# Patient Record
Sex: Male | Born: 1975 | Race: Black or African American | Hispanic: No | Marital: Married | Smoking: Never smoker
Health system: Southern US, Community
[De-identification: ages and names within clinical notes are randomized; demographics above are authoritative.]

## PROBLEM LIST (undated history)

## (undated) DIAGNOSIS — T7840XA Allergy, unspecified, initial encounter: Secondary | ICD-10-CM

## (undated) DIAGNOSIS — F419 Anxiety disorder, unspecified: Secondary | ICD-10-CM

## (undated) DIAGNOSIS — F32A Depression, unspecified: Secondary | ICD-10-CM

## (undated) HISTORY — DX: Anxiety disorder, unspecified: F41.9

## (undated) HISTORY — PX: FINGER SURGERY: SHX640

## (undated) HISTORY — PX: COSMETIC SURGERY: SHX468

## (undated) HISTORY — DX: Allergy, unspecified, initial encounter: T78.40XA

## (undated) HISTORY — DX: Depression, unspecified: F32.A

---

## 1997-09-16 ENCOUNTER — Emergency Department (HOSPITAL_COMMUNITY): Admission: EM | Admit: 1997-09-16 | Discharge: 1997-09-16 | Payer: Self-pay

## 2009-06-25 ENCOUNTER — Ambulatory Visit: Payer: Self-pay | Admitting: Diagnostic Radiology

## 2009-06-25 ENCOUNTER — Emergency Department (HOSPITAL_BASED_OUTPATIENT_CLINIC_OR_DEPARTMENT_OTHER): Admission: EM | Admit: 2009-06-25 | Discharge: 2009-06-25 | Payer: Self-pay | Admitting: Emergency Medicine

## 2009-07-06 ENCOUNTER — Ambulatory Visit: Payer: Self-pay | Admitting: Diagnostic Radiology

## 2009-07-06 ENCOUNTER — Ambulatory Visit (HOSPITAL_BASED_OUTPATIENT_CLINIC_OR_DEPARTMENT_OTHER): Admission: RE | Admit: 2009-07-06 | Discharge: 2009-07-06 | Payer: Self-pay | Admitting: Orthopaedic Surgery

## 2010-05-20 LAB — COMPREHENSIVE METABOLIC PANEL
AST: 35 U/L (ref 0–37)
Albumin: 4.4 g/dL (ref 3.5–5.2)
CO2: 27 mEq/L (ref 19–32)
Calcium: 9.7 mg/dL (ref 8.4–10.5)
Chloride: 102 mEq/L (ref 96–112)
Creatinine, Ser: 1.4 mg/dL (ref 0.4–1.5)
Sodium: 143 mEq/L (ref 135–145)
Total Bilirubin: 1.1 mg/dL (ref 0.3–1.2)

## 2010-05-20 LAB — CBC
MCHC: 32.8 g/dL (ref 30.0–36.0)
MCV: 87.2 fL (ref 78.0–100.0)
RBC: 4.78 MIL/uL (ref 4.22–5.81)
RDW: 11.5 % (ref 11.5–15.5)
WBC: 5.5 10*3/uL (ref 4.0–10.5)

## 2010-07-11 ENCOUNTER — Emergency Department (HOSPITAL_COMMUNITY): Payer: No Typology Code available for payment source

## 2010-07-11 ENCOUNTER — Emergency Department (HOSPITAL_COMMUNITY)
Admission: EM | Admit: 2010-07-11 | Discharge: 2010-07-11 | Disposition: A | Discharge status: Home / Self Care | Payer: No Typology Code available for payment source | Attending: Emergency Medicine | Admitting: Emergency Medicine

## 2010-07-11 DIAGNOSIS — IMO0002 Reserved for concepts with insufficient information to code with codable children: Secondary | ICD-10-CM | POA: Insufficient documentation

## 2010-07-11 DIAGNOSIS — M542 Cervicalgia: Secondary | ICD-10-CM | POA: Insufficient documentation

## 2013-01-02 ENCOUNTER — Encounter (HOSPITAL_BASED_OUTPATIENT_CLINIC_OR_DEPARTMENT_OTHER): Payer: Self-pay | Admitting: Emergency Medicine

## 2013-01-02 ENCOUNTER — Emergency Department (HOSPITAL_BASED_OUTPATIENT_CLINIC_OR_DEPARTMENT_OTHER)
Admission: EM | Admit: 2013-01-02 | Discharge: 2013-01-02 | Disposition: A | Discharge status: Home / Self Care | Payer: Self-pay | Attending: Emergency Medicine | Admitting: Emergency Medicine

## 2013-01-02 DIAGNOSIS — K644 Residual hemorrhoidal skin tags: Secondary | ICD-10-CM | POA: Insufficient documentation

## 2013-01-02 MED ORDER — HYDROCORTISONE ACE-PRAMOXINE 1-1 % RE FOAM: 1.0000 | Freq: Two times a day (BID) | RECTAL | Status: DC

## 2013-01-02 MED ORDER — OXYCODONE-ACETAMINOPHEN 5-325 MG PO TABS
1.0000 | ORAL_TABLET | Freq: Once | ORAL | Status: DC
  Filled 2013-01-02: qty 1

## 2013-01-02 NOTE — ED Notes (Signed)
Pt reports rectal pain x 3 weeks.  Reports itching and pain.  Hx of hemorrhoids.

## 2013-01-02 NOTE — ED Provider Notes (Signed)
CSN: [id number]     Arrival date & time 01/02/13  0957 History   First MD Initiated Contact with Patient 01/02/13 1009     Chief Complaint  Patient presents with  . Hemorrhoids   (Consider location/radiation/quality/duration/timing/severity/associated sxs/prior Treatment) Patient is a 37 y.o. male presenting with hematochezia.  Rectal Bleeding Duration:  3 weeks Timing:  Intermittent Progression:  Waxing and waning Chronicity:  Recurrent Context: hemorrhoids   Similar prior episodes: yes   Relieved by:  None tried Worsened by:  Defecation Ineffective treatments:  None tried Patient reports three week history of rectal itching and pain.  Has a history of hemorrhoids.  Has not noticed any blood on tissue after defecation.   History reviewed. No pertinent past medical history. History reviewed. No pertinent past surgical history. History reviewed. No pertinent family history. History  Substance Use Topics  . Smoking status: Never Smoker   . Smokeless tobacco: Not on file  . Alcohol Use: No    Review of Systems  Gastrointestinal: Positive for hematochezia and rectal pain. Negative for blood in stool and anal bleeding.  All other systems reviewed and are negative.    Allergies  Review of patient's allergies indicates no known allergies.  Home Medications  No current outpatient prescriptions on file. BP 143/82  Pulse 110  Temp(Src) 98.3 F (36.8 C) (Oral)  Resp 16  Ht 5\' 8"  (1.727 m)  Wt 177 lb (80.287 kg)  BMI 26.92 kg/m2  SpO2 100% Physical Exam  Nursing note and vitals reviewed. Constitutional: He is oriented to person, place, and time. He appears well-developed and well-nourished.  HENT:  Head: Normocephalic.  Eyes: Pupils are equal, round, and reactive to light.  Neck: Normal range of motion.  Cardiovascular: Normal rate and regular rhythm.   Pulmonary/Chest: Effort normal and breath sounds normal.  Abdominal: Soft. Bowel sounds are normal.  Genitourinary:  Rectal exam shows external hemorrhoid. Rectal exam shows no internal hemorrhoid.     Musculoskeletal: He exhibits no edema and no tenderness.  Lymphadenopathy:    He has no cervical adenopathy.  Neurological: He is alert and oriented to person, place, and time.  Skin: Skin is warm and dry. No rash noted.  Psychiatric: He has a normal mood and affect. His behavior is normal. Thought content normal.    ED Course  Procedures (including critical care time) Labs Review Labs Reviewed - No data to display Imaging Review No results found.  EKG Interpretation   None      Proctofoam HC prescription.  General surgery follow-up if needed. MDM   External hemorrhoid.    Jimmye Norman, NP 01/02/13 [phone]

## 2013-01-02 NOTE — ED Provider Notes (Signed)
Medical screening examination/treatment/procedure(s) were performed by non-physician practitioner and as supervising physician I was immediately available for consultation/collaboration.  EKG Interpretation   None         Glynn Octave, MD 01/02/13 1559

## 2013-02-03 ENCOUNTER — Encounter (HOSPITAL_BASED_OUTPATIENT_CLINIC_OR_DEPARTMENT_OTHER): Payer: Self-pay | Admitting: Emergency Medicine

## 2013-02-03 ENCOUNTER — Emergency Department (HOSPITAL_BASED_OUTPATIENT_CLINIC_OR_DEPARTMENT_OTHER): Payer: Self-pay

## 2013-02-03 ENCOUNTER — Emergency Department (HOSPITAL_BASED_OUTPATIENT_CLINIC_OR_DEPARTMENT_OTHER)
Admission: EM | Admit: 2013-02-03 | Discharge: 2013-02-03 | Disposition: A | Discharge status: Home / Self Care | Payer: Self-pay | Attending: Emergency Medicine | Admitting: Emergency Medicine

## 2013-02-03 DIAGNOSIS — IMO0002 Reserved for concepts with insufficient information to code with codable children: Secondary | ICD-10-CM | POA: Insufficient documentation

## 2013-02-03 DIAGNOSIS — Y92410 Unspecified street and highway as the place of occurrence of the external cause: Secondary | ICD-10-CM | POA: Insufficient documentation

## 2013-02-03 DIAGNOSIS — S8002XA Contusion of left knee, initial encounter: Secondary | ICD-10-CM

## 2013-02-03 DIAGNOSIS — S8000XA Contusion of unspecified knee, initial encounter: Secondary | ICD-10-CM | POA: Insufficient documentation

## 2013-02-03 DIAGNOSIS — Y9389 Activity, other specified: Secondary | ICD-10-CM | POA: Insufficient documentation

## 2013-02-03 MED ORDER — IBUPROFEN 200 MG PO TABS: 400.0000 mg | ORAL_TABLET | Freq: Four times a day (QID) | ORAL | Status: DC | PRN
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
## 2013-03-24 ENCOUNTER — Encounter (INDEPENDENT_AMBULATORY_CARE_PROVIDER_SITE_OTHER): Payer: Self-pay

## 2013-03-24 ENCOUNTER — Encounter: Payer: Self-pay | Admitting: Family Medicine

## 2013-03-24 ENCOUNTER — Ambulatory Visit (INDEPENDENT_AMBULATORY_CARE_PROVIDER_SITE_OTHER): Payer: Self-pay | Admitting: Family Medicine

## 2013-03-24 VITALS — BP 146/90 | HR 80 | Ht 68.0 in | Wt 182.0 lb

## 2013-03-24 DIAGNOSIS — S8990XA Unspecified injury of unspecified lower leg, initial encounter: Secondary | ICD-10-CM

## 2013-03-24 DIAGNOSIS — M25569 Pain in unspecified knee: Secondary | ICD-10-CM

## 2013-03-24 DIAGNOSIS — S99919A Unspecified injury of unspecified ankle, initial encounter: Secondary | ICD-10-CM

## 2013-03-24 DIAGNOSIS — S99929A Unspecified injury of unspecified foot, initial encounter: Secondary | ICD-10-CM

## 2013-03-24 DIAGNOSIS — S8992XA Unspecified injury of left lower leg, initial encounter: Secondary | ICD-10-CM

## 2013-03-24 DIAGNOSIS — M25562 Pain in left knee: Secondary | ICD-10-CM

## 2013-03-24 MED ORDER — MELOXICAM 15 MG PO TABS: 15.0000 mg | ORAL_TABLET | Freq: Every day | ORAL | Status: DC

## 2013-03-24 NOTE — Patient Instructions (Signed)
Your history and exam are consistent with a medial meniscus tear or a knee contusion that is taking longer than usual to resolve. Will treat conservatively for both. Start physical therapy and do home exercises on days you don't go to therapy. Take meloxicam 15mg  daily with food for pain and inflammation. Icing as needed 15 minutes at a time. Try to avoid deep squats, lunges, flexing knee fully unless in physical therapy and they advance you to these things. Follow up with me in 1 month to 6 weeks. If not improving would consider MRI at that time.

## 2013-03-28 ENCOUNTER — Encounter: Payer: Self-pay | Admitting: Family Medicine

## 2013-03-28 DIAGNOSIS — S8992XA Unspecified injury of left lower leg, initial encounter: Secondary | ICD-10-CM | POA: Insufficient documentation

## 2013-03-28 NOTE — Assessment & Plan Note (Signed)
consistent with a contusion most likely vs medial meniscal tear.  Start physical therapy, meloxicam, icing.  Will reassess in 1 month to 6 weeks.  If still not improving would consider MRI to assess for meniscal tear.
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
## 2013-03-30 ENCOUNTER — Encounter: Payer: Self-pay | Admitting: Family Medicine

## 2013-05-05 ENCOUNTER — Ambulatory Visit: Payer: Self-pay | Admitting: Family Medicine
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
## 2015-06-11 ENCOUNTER — Emergency Department (HOSPITAL_BASED_OUTPATIENT_CLINIC_OR_DEPARTMENT_OTHER)
Admission: EM | Admit: 2015-06-11 | Discharge: 2015-06-11 | Disposition: A | Discharge status: Home / Self Care | Payer: No Typology Code available for payment source | Attending: Emergency Medicine | Admitting: Emergency Medicine

## 2015-06-11 ENCOUNTER — Encounter (HOSPITAL_BASED_OUTPATIENT_CLINIC_OR_DEPARTMENT_OTHER): Payer: Self-pay | Admitting: *Deleted

## 2015-06-11 DIAGNOSIS — Y92410 Unspecified street and highway as the place of occurrence of the external cause: Secondary | ICD-10-CM | POA: Diagnosis not present

## 2015-06-11 DIAGNOSIS — Y999 Unspecified external cause status: Secondary | ICD-10-CM | POA: Insufficient documentation

## 2015-06-11 DIAGNOSIS — Y9389 Activity, other specified: Secondary | ICD-10-CM | POA: Diagnosis not present

## 2015-06-11 DIAGNOSIS — M545 Low back pain: Secondary | ICD-10-CM | POA: Diagnosis not present

## 2015-06-11 MED ORDER — METHOCARBAMOL 500 MG PO TABS: 500.0000 mg | ORAL_TABLET | Freq: Two times a day (BID) | ORAL | Status: DC

## 2015-06-11 MED ORDER — HYDROCODONE-ACETAMINOPHEN 5-325 MG PO TABS: 1.0000 | ORAL_TABLET | ORAL | Status: DC | PRN
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
## 2015-08-02 ENCOUNTER — Emergency Department (HOSPITAL_BASED_OUTPATIENT_CLINIC_OR_DEPARTMENT_OTHER)
Admission: EM | Admit: 2015-08-02 | Discharge: 2015-08-02 | Disposition: A | Discharge status: Home / Self Care | Payer: No Typology Code available for payment source | Attending: Emergency Medicine | Admitting: Emergency Medicine

## 2015-08-02 ENCOUNTER — Encounter (HOSPITAL_BASED_OUTPATIENT_CLINIC_OR_DEPARTMENT_OTHER): Payer: Self-pay | Admitting: *Deleted

## 2015-08-02 DIAGNOSIS — T7840XA Allergy, unspecified, initial encounter: Secondary | ICD-10-CM

## 2015-08-02 MED ORDER — METHYLPREDNISOLONE SODIUM SUCC 125 MG IJ SOLR
125.0000 mg | Freq: Once | INTRAMUSCULAR | Status: AC
  Administered 2015-08-02: 125 mg via INTRAVENOUS
  Filled 2015-08-02: qty 2

## 2015-08-02 MED ORDER — DIPHENHYDRAMINE HCL 50 MG/ML IJ SOLN
50.0000 mg | Freq: Once | INTRAMUSCULAR | Status: AC
  Administered 2015-08-02: 50 mg via INTRAVENOUS
  Filled 2015-08-02: qty 1

## 2015-08-02 MED ORDER — DIPHENHYDRAMINE HCL 25 MG PO TABS: 50.0000 mg | ORAL_TABLET | ORAL | Status: DC | PRN

## 2015-08-02 MED ORDER — PREDNISONE 20 MG PO TABS: ORAL_TABLET | ORAL | Status: DC

## 2015-08-02 MED ORDER — FAMOTIDINE IN NACL 20-0.9 MG/50ML-% IV SOLN
20.0000 mg | Freq: Once | INTRAVENOUS | Status: AC
  Administered 2015-08-02: 20 mg via INTRAVENOUS
  Filled 2015-08-02: qty 50

## 2015-08-02 MED ORDER — EPINEPHRINE 0.3 MG/0.3ML IJ SOAJ: 0.3000 mg | Freq: Once | INTRAMUSCULAR | Status: DC

## 2015-08-02 NOTE — ED Provider Notes (Signed)
Medical screening examination/treatment/procedure(s) were conducted as a shared visit with non-physician practitioner(s) and myself.  I personally evaluated the patient during the encounter.  Here with lip swelling, hives of unknown origin. Was bit by a bug last night, and subsequently woke up this AM with hives. Worsened throughout the day brought here for eval. Already had steroids, benadryl, pepcid on my evaluation. Still with angioedema of his lips and slight rash on right arm c/w hives.  Likely allergic reaction, is doing well with conservative allergy treatment at this time. Will observe and reevaluate for appropriatenss of discharge.   Marily MemosJason Eloina Ergle, MD 08/02/15 2137

## 2015-08-02 NOTE — ED Notes (Signed)
Pt. Still has edema noted in his lips upper and lower.  No hives noted on Pt. Body.  Pt. Able to swallow and talk with no difficulty.  Pt. In no resp. Distress.

## 2015-08-02 NOTE — ED Notes (Signed)
Woke with itching. Hives later in the day. Lip is swollen. He took Benadryl over 2 hours ago. He is in no respiratory distress.

## 2015-08-02 NOTE — ED Provider Notes (Signed)
CSN: [id number]     Arrival date & time 08/02/15  1425 History   First MD Initiated Contact with Patient 08/02/15 1433     Chief Complaint  Patient presents with  . Allergic Reaction     (Consider location/radiation/quality/duration/timing/severity/associated sxs/prior Treatment) The history is provided by the patient and medical records. No language interpreter was used.     Joshua Sims is a 40 y.o. male  with no major medical problems presents to the Emergency Department complaining of gradual, persistent, progressively worsening rash to the upper arms and right leg onset 7am this morning. Associated symptoms include lip swelling onset 11am this morning.  Pt reports taking 2 benadryl around 7:30 this morning without improvement.  He denies New lotions, detergents, clothing, environmental exposures. He reports he took a pre-workout tablet yesterday at 1 PM did not have any issues yesterday evening or overnight. Patient denies current or previous usage of lisinopril or statins.  He is not currently taking any medications.  Eyes any known allergies.  Eyes difficult breathing or difficulty swallowing. No nausea or vomiting. No abdominal pain. No history of hereditary angioedema.  History reviewed. No pertinent past medical history. Past Surgical History  Procedure Laterality Date  . Finger surgery     Family History  Problem Relation Age of Onset  . Sudden death Neg Hx   . Hypertension Neg Hx   . Hyperlipidemia Neg Hx   . Heart attack Neg Hx   . Diabetes Neg Hx    Social History  Substance Use Topics  . Smoking status: Never Smoker   . Smokeless tobacco: None  . Alcohol Use: No    Review of Systems  Constitutional: Negative for fever, diaphoresis, appetite change, fatigue and unexpected weight change.  HENT: Positive for facial swelling. Negative for mouth sores.   Eyes: Negative for visual disturbance.  Respiratory: Negative for cough, chest tightness, shortness of breath and  wheezing.   Cardiovascular: Negative for chest pain.  Gastrointestinal: Negative for nausea, vomiting, abdominal pain, diarrhea and constipation.  Endocrine: Negative for polydipsia, polyphagia and polyuria.  Genitourinary: Negative for dysuria, urgency, frequency and hematuria.  Musculoskeletal: Negative for back pain and neck stiffness.  Skin: Positive for rash.  Allergic/Immunologic: Negative for immunocompromised state.  Neurological: Negative for syncope, light-headedness and headaches.  Hematological: Does not bruise/bleed easily.  Psychiatric/Behavioral: Negative for sleep disturbance. The patient is not nervous/anxious.       Allergies  Review of patient's allergies indicates no known allergies.  Home Medications   Prior to Admission medications   Medication Sig Start Date End Date Taking? Authorizing Provider  HYDROcodone-acetaminophen (NORCO/VICODIN) 5-325 MG tablet Take 1 tablet by mouth every 4 (four) hours as needed. 06/11/15   Garlon HatchetLisa M Sanders, PA-C  hydrocortisone-pramoxine (PROCTOFOAM [facility]) rectal foam Place 1 applicator rectally 2 (two) times daily. 01/02/13   Felicie Mornavid Smith, NP  meloxicam (MOBIC) 15 MG tablet Take 1 tablet (15 mg total) by mouth daily. With food. 03/24/13   Lenda KelpShane R Hudnall, MD  methocarbamol (ROBAXIN) 500 MG tablet Take 1 tablet (500 mg total) by mouth 2 (two) times daily. 06/11/15   Garlon HatchetLisa M Sanders, PA-C   BP 128/92 mmHg  Pulse 86  Temp(Src) 98 F (36.7 C) (Oral)  Resp 20  Ht 5' 7.5" (1.715 m)  Wt 82.555 kg  BMI 28.07 kg/m2  SpO2 100% Physical Exam  Constitutional: He is oriented to person, place, and time. He appears well-developed and well-nourished. No distress.  HENT:  Head: Normocephalic and  atraumatic.  Right Ear: Tympanic membrane, external ear and ear canal normal.  Left Ear: Tympanic membrane, external ear and ear canal normal.  Nose: Nose normal. No mucosal edema or rhinorrhea.  Mouth/Throat: Uvula is midline and oropharynx is clear and  moist. No uvula swelling. No oropharyngeal exudate, posterior oropharyngeal edema, posterior oropharyngeal erythema or tonsillar abscesses.  No swelling of the uvula or oropharynx; no tongue swelling Swelling of the upper and lower lips noted   Eyes: Conjunctivae are normal.  Neck: Normal range of motion.  Patent airway No stridor; normal phonation Handling secretions without difficulty  Cardiovascular: Normal rate, normal heart sounds and intact distal pulses.   No murmur heard. Pulmonary/Chest: Effort normal and breath sounds normal. No stridor. No respiratory distress. He has no wheezes.  No wheezes or rhonchi  Abdominal: Soft. Bowel sounds are normal. There is no tenderness.  Musculoskeletal: Normal range of motion. He exhibits no edema.  Neurological: He is alert and oriented to person, place, and time.  Skin: Skin is warm and dry. Rash noted. He is not diaphoretic.  Urticaria noted to the bilateral upper arms and right hip and leg; no rash noted to the torso or back Mild excoriations - no induration or fluctuance to indicate secondary infection  Psychiatric: He has a normal mood and affect.  Nursing note and vitals reviewed.   ED Course  Procedures (including critical care time)   MDM   Final diagnoses:  Allergic reaction, initial encounter   Joshua Sims presents with allergic reaction including urticaria and swelling of the upper and lower lips. No history of angioedema or medications to suggest same. Patient will be given IV Benadryl, slightly Medrol and Pepcid.  3:00 PM At shift change care was transferred to Dr. Clayborne Dana who will re-evaluate and determine disposition.    Dierdre Forth, PA-C 08/02/15 1501  Marily Memos, MD 08/02/15 1530  Marily Memos, MD 08/02/15 1531

## 2018-12-15 ENCOUNTER — Other Ambulatory Visit: Payer: Self-pay

## 2018-12-15 DIAGNOSIS — Z20822 Contact with and (suspected) exposure to covid-19: Secondary | ICD-10-CM

## 2018-12-16 LAB — NOVEL CORONAVIRUS, NAA: SARS-CoV-2, NAA: NOT DETECTED

## 2021-07-03 ENCOUNTER — Encounter (HOSPITAL_COMMUNITY): Payer: Self-pay | Admitting: Emergency Medicine

## 2021-07-03 ENCOUNTER — Ambulatory Visit (HOSPITAL_COMMUNITY)
Admission: EM | Admit: 2021-07-03 | Discharge: 2021-07-03 | Disposition: A | Discharge status: Home / Self Care | Payer: Managed Care, Other (non HMO) | Attending: Family Medicine | Admitting: Family Medicine

## 2021-07-03 ENCOUNTER — Other Ambulatory Visit: Payer: Self-pay

## 2021-07-03 DIAGNOSIS — R3129 Other microscopic hematuria: Secondary | ICD-10-CM

## 2021-07-03 DIAGNOSIS — R072 Precordial pain: Secondary | ICD-10-CM | POA: Diagnosis not present

## 2021-07-03 DIAGNOSIS — F418 Other specified anxiety disorders: Secondary | ICD-10-CM | POA: Diagnosis not present

## 2021-07-03 LAB — POCT URINALYSIS DIPSTICK, ED / UC
Bilirubin Urine: NEGATIVE
Glucose, UA: 100 mg/dL — AB
Ketones, ur: NEGATIVE mg/dL
Leukocytes,Ua: NEGATIVE
Nitrite: NEGATIVE
Protein, ur: NEGATIVE mg/dL
Specific Gravity, Urine: 1.015 (ref 1.005–1.030)
Urobilinogen, UA: 0.2 mg/dL (ref 0.0–1.0)
pH: 7 (ref 5.0–8.0)

## 2021-07-03 NOTE — ED Triage Notes (Signed)
Patient noticed blood in urine this morning.  Patient says a month ago was given a dot test and was told blood in urine.  New pcp cant see patient until September 2023.   ? ?Denies pain presently.  Patient complains of stress  ? ?

## 2021-07-03 NOTE — ED Provider Notes (Signed)
?[facility] ? ? ? ?CSN: [id number] ?Arrival date & time: 07/03/21  1414 ? ? ?  ? ?History   ?Chief Complaint ?Chief Complaint  ?Patient presents with  ? Hematuria  ? ? ?HPI ?Joshua Sims is a 46 y.o. male.  ? ? ?Hematuria ? ?Here for 2 things ? ?First he had chest pain like a cramp in his left upper chest this morning when he was driving to work.  It lasted 15 or 20 minutes.  No associated diaphoresis, vomiting, or diarrhea.  No shortness of breath. ? 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  He cannot see his new primary care office till September.  He is established with another primary care office already. ?Maybe, he has had a little mild dysuria.  Urine has looked concentrated or dark sometimes ? ?No fever or chills or cough/cold symptoms. ? ?He does note a good bit of stress and anxiety.  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  No suicidal ideations ? ?History reviewed. No pertinent past medical history. ? ?Patient Active Problem List  ? Diagnosis Date Noted  ? Left knee injury 03/28/2013  ? ? ?Past Surgical History:  ?Procedure Laterality Date  ? FINGER SURGERY    ? ? ? ? ? ?Home Medications   ? ?Prior to Admission medications   ?Medication Sig Start Date End Date Taking? Authorizing Provider  ?EPINEPHrine (EPIPEN 2-PAK) 0.3 mg/0.3 mL IJ SOAJ injection Inject 0.3 mLs (0.3 mg total) into the muscle once. 08/02/15   Mesner, Barbara Cower, MD  ? ? ?Family History ?Family History  ?Problem Relation Age of Onset  ? Sudden death Neg Hx   ? Hypertension Neg Hx   ? Hyperlipidemia Neg Hx   ? Heart attack Neg Hx   ? Diabetes Neg Hx   ? ? ?Social History ?Social History  ? ?Tobacco Use  ? Smoking status: Never  ?Vaping Use  ? Vaping Use: Never used  ?Substance Use Topics  ? Alcohol use: No  ? Drug use: No  ? ? ? ?Allergies   ?Patient has no known allergies. ? ? ?Review of Systems ?Review of Systems  ?Genitourinary:  Positive for hematuria.  ? ? ?Physical  Exam ?Triage Vital Signs ?ED Triage Vitals  ?Enc Vitals Group  ?   BP 07/03/21 1453 (!) 145/88  ?   Pulse Rate 07/03/21 1453 85  ?   Resp 07/03/21 1453 20  ?   Temp 07/03/21 1453 98.5 ?F (36.9 ?C)  ?   Temp Source 07/03/21 1453 Oral  ?   SpO2 07/03/21 1453 98 %  ?   Weight --   ?   Height --   ?   Head Circumference --   ?   Peak Flow --   ?   Pain Score 07/03/21 1450 0  ?   Pain Loc --   ?   Pain Edu? --   ?   Excl. in GC? --   ? ?No data found. ? ?Updated Vital Signs ?BP (!) 145/88 (BP Location: Left Arm)   Pulse 85   Temp 98.5 ?F (36.9 ?C) (Oral)   Resp 20   SpO2 98%  ? ?Visual Acuity ?Right Eye Distance:   ?Left Eye Distance:   ?Bilateral Distance:   ? ?Right Eye Near:   ?Left Eye Near:    ?Bilateral Near:    ? ?Physical Exam ?Vitals reviewed.  ?Constitutional:   ?  General: He is not in acute distress. ?   Appearance: He is not toxic-appearing.  ?HENT:  ?   Mouth/Throat:  ?   Mouth: Mucous membranes are moist.  ?Eyes:  ?   Extraocular Movements: Extraocular movements intact.  ?   Conjunctiva/sclera: Conjunctivae normal.  ?   Pupils: Pupils are equal, round, and reactive to light.  ?Cardiovascular:  ?   Rate and Rhythm: Normal rate and regular rhythm.  ?   Heart sounds: No murmur heard. ?Pulmonary:  ?   Effort: Pulmonary effort is normal.  ?   Breath sounds: Normal breath sounds. No stridor. No wheezing, rhonchi or rales.  ?Chest:  ?   Chest wall: No tenderness.  ?Abdominal:  ?   Palpations: Abdomen is soft.  ?   Tenderness: There is no abdominal tenderness.  ?Musculoskeletal:  ?   Cervical back: Neck supple.  ?Lymphadenopathy:  ?   Cervical: No cervical adenopathy.  ?Skin: ?   Capillary Refill: Capillary refill takes less than 2 seconds.  ?   Coloration: Skin is not jaundiced or pale.  ?Neurological:  ?   Mental Status: He is alert and oriented to person, place, and time.  ?Psychiatric:     ?   Behavior: Behavior normal.  ? ? ? ?UC Treatments / Results  ?Labs ?(all labs ordered are listed, but only  abnormal results are displayed) ?Labs Reviewed  ?POCT URINALYSIS DIPSTICK, ED / UC - Abnormal; Notable for the following components:  ?    Result Value  ? Glucose, UA 100 (*)   ? Hgb urine dipstick TRACE (*)   ? All other components within normal limits  ? ? ?EKG ? ? ?Radiology ?No results found. ? ?Procedures ?Procedures (including critical care time) ? ?Medications Ordered in UC ?Medications - No data to display ? ?Initial Impression / Assessment and Plan / UC Course  ?I have reviewed the triage vital signs and the nursing notes. ? ?Pertinent labs & imaging results that were available during my care of the patient were reviewed by me and considered in my medical decision making (see chart for details). ? ?  ? ?Urinalysis shows a trace of blood only. ? ?EKG is benign without any significant ST changes ? ?He will be given contact information for urology to evaluate the microscopic hematuria. ? ?He is already seeing counseling.  I have recommended that he see primary care in the next 3 to 4 weeks, that is his current primary care if his anxiety is not improving ?Final Clinical Impressions(s) / UC Diagnoses  ? ?Final diagnoses:  ?Microscopic hematuria  ?Precordial pain  ?Situational anxiety  ? ? ? ?Discharge Instructions   ? ?  ?The urinalysis just showed a trace of blood.  This may or may not be significant.  Please call the urology office for an appointment ? ?The EKG looked good today.  Continue the counseling.  If your anxiety is not improving in the next month or so, I would see your primary care, to consider medication ? ? ? ? ?ED Prescriptions   ?None ?  ? ?PDMP not reviewed this encounter. ?  ?Zenia Resides, MD ?07/03/21 1528 ? ?

## 2021-07-03 NOTE — Discharge Instructions (Addendum)
The urinalysis just showed a trace of blood.  This may or may not be significant.  Please call the urology office for an appointment ? ?The EKG looked good today.  Continue the counseling.  If your anxiety is not improving in the next month or so, I would see your primary care, to consider medication ?

## 2021-11-18 ENCOUNTER — Ambulatory Visit (INDEPENDENT_AMBULATORY_CARE_PROVIDER_SITE_OTHER): Payer: Commercial Managed Care - HMO | Admitting: Nurse Practitioner

## 2021-11-18 ENCOUNTER — Encounter: Payer: Self-pay | Admitting: Nurse Practitioner

## 2021-11-18 VITALS — BP 120/80 | HR 70 | Temp 97.9°F | Ht 66.0 in | Wt 175.4 lb

## 2021-11-18 DIAGNOSIS — F419 Anxiety disorder, unspecified: Secondary | ICD-10-CM

## 2021-11-18 DIAGNOSIS — K640 First degree hemorrhoids: Secondary | ICD-10-CM | POA: Diagnosis not present

## 2021-11-18 DIAGNOSIS — Z1159 Encounter for screening for other viral diseases: Secondary | ICD-10-CM

## 2021-11-18 DIAGNOSIS — Z889 Allergy status to unspecified drugs, medicaments and biological substances status: Secondary | ICD-10-CM

## 2021-11-18 DIAGNOSIS — Z7689 Persons encountering health services in other specified circumstances: Secondary | ICD-10-CM

## 2021-11-18 DIAGNOSIS — Z114 Encounter for screening for human immunodeficiency virus [HIV]: Secondary | ICD-10-CM

## 2021-11-18 DIAGNOSIS — Z8616 Personal history of COVID-19: Secondary | ICD-10-CM

## 2021-11-18 DIAGNOSIS — F5101 Primary insomnia: Secondary | ICD-10-CM

## 2021-11-18 DIAGNOSIS — F321 Major depressive disorder, single episode, moderate: Secondary | ICD-10-CM | POA: Diagnosis not present

## 2021-11-18 DIAGNOSIS — E7800 Pure hypercholesterolemia, unspecified: Secondary | ICD-10-CM

## 2021-11-18 DIAGNOSIS — Z1211 Encounter for screening for malignant neoplasm of colon: Secondary | ICD-10-CM

## 2021-11-18 MED ORDER — EPINEPHRINE 0.3 MG/0.3ML IJ SOAJ: 0.3000 mg | Freq: Once | INTRAMUSCULAR | 0 refills | Status: AC

## 2021-11-18 MED ORDER — MELATONIN 5 MG PO TABS: 5.0000 mg | ORAL_TABLET | Freq: Every evening | ORAL | 1 refills | Status: AC | PRN

## 2021-11-18 MED ORDER — ASHWAGANDHA 120 MG PO CAPS: 1.0000 | ORAL_CAPSULE | Freq: Every day | ORAL | 2 refills | Status: AC

## 2021-11-18 MED ORDER — MAGNESIUM 400 MG PO TABS: 1.0000 | ORAL_TABLET | Freq: Every evening | ORAL | 2 refills | Status: AC

## 2021-11-18 MED ORDER — HYDROCORT-PRAMOXINE (PERIANAL) 1-1 % EX FOAM: 1.0000 | Freq: Two times a day (BID) | CUTANEOUS | 2 refills | Status: DC

## 2021-11-18 NOTE — Progress Notes (Signed)
Barnet Glasgow Martin,acting as a Education administrator for Minette Brine, FNP.,have documented all relevant documentation on the behalf of Minette Brine, FNP,as directed by  Minette Brine, FNP while in the presence of Minette Brine, [place].    Subjective:     Patient ID: Joshua Sims , male    DOB: 03-17-1975 , 46 y.o.   MRN: [id number]   Chief Complaint  Patient presents with   Establish Care    HPI  Patient presents today to establish care. Was being seen at [facility] for about 2 years, last seen 1 year ago.  Wanted to change for a different perspective. 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 Married. 3 sons (54, 55, and 7).    PMH - seasonal allergies (sudafed),he feels like maybe spices in chicken. He has had to have an epipen - he broke out in hives and had benadryl in his IV and had lip swelling. He feels like he has anxiety and depression but not officially diagnosed. He is currently going to a therapist for 4 months. No alcohol (occasional) or drug use.   Mother - stents to her legs (she had a history of smoking), maternal grandmother with diabetes. Has 2 brothers on his father's side.   Patient states his hemorrhoids are having a flare up. Since driving a truck he has had issues with his hemorroids. He has been treated in the past with cortisone. Reports being on the outside of his rectal area.   Patient states he thinks he has depression but doesn't want medication. He states it is hard for him to stay asleep almost every night he wakes up at 2/3 and cant go back to sleep, he states he sometimes has trouble falling sleep. [facility] therapist in [place]. He is a Engineer, drilling. He does feel like the challenges with his job   Patient states when he doesn't eat he sometimes gets the shakes.         Past Medical History:  Diagnosis Date   Allergy    Anxiety    Depression      Family History  Problem Relation Age of Onset   Sudden death Neg Hx    Hypertension Neg Hx    Hyperlipidemia Neg  Hx    Heart attack Neg Hx    Diabetes Neg Hx      Current Outpatient Medications:    Ashwagandha 120 MG CAPS, Take 1 tablet by mouth at bedtime., Disp: 30 capsule, Rfl: 2   Magnesium 400 MG TABS, Take 1 tablet by mouth every evening., Disp: 30 tablet, Rfl: 2   melatonin 5 MG TABS, Take 1 tablet (5 mg total) by mouth at bedtime as needed., Disp: 90 tablet, Rfl: 1   Pramoxine-HC 1-2.5 % OINT, Apply 1 each topically 2 (two) times daily as needed., Disp: 28.4 g, Rfl: 5   No Known Allergies   Review of Systems  Constitutional: Negative.   HENT: Negative.    Eyes: Negative.   Respiratory: Negative.    Cardiovascular: Negative.   Gastrointestinal: Negative.   Psychiatric/Behavioral: Negative.       Today's Vitals   11/18/21 1141  BP: 120/80  Pulse: 70  Temp: 97.9 F (36.6 C)  TempSrc: Oral  Weight: 175 lb 6.4 oz (79.6 kg)  Height: _0  (1.676 m)  PainSc: 0-No pain   Body mass index is 28.31 kg/m.   Objective:  Physical Exam Vitals reviewed.  Constitutional:      General: He is not in  acute distress.    Appearance: Normal appearance. He is obese.  HENT:     Head: Normocephalic.  Cardiovascular:     Rate and Rhythm: Normal rate and regular rhythm.     Pulses: Normal pulses.     Heart sounds: Normal heart sounds. No murmur heard. Pulmonary:     Effort: Pulmonary effort is normal. No respiratory distress.     Breath sounds: Normal breath sounds. No wheezing.  Musculoskeletal:        General: Normal range of motion.  Skin:    General: Skin is warm and dry.     Capillary Refill: Capillary refill takes less than 2 seconds.  Neurological:     General: No focal deficit present.     Mental Status: He is alert and oriented to person, place, and time.  Psychiatric:        Mood and Affect: Mood normal.        Behavior: Behavior normal.        Thought Content: Thought content normal.        Judgment: Judgment normal.         Assessment And Plan:     1. Elevated  cholesterol - Lipid panel  2. Grade I hemorrhoids Comments: History of hemorrhoids and has used a cream in the past, more bothersome when driving long periods. Will send Rx - CMP14+EGFR - CBC  3. Anxiety Comments: Continue f/u with therapist, declines medications at this time  4. Current moderate episode of major depressive disorder without prior episode [facility]) Comments: Continue f/u with therapist, declines medications at this time. Depression screen score is 11. Will check testosterone level - TSH - Testosterone, Total  5. Primary insomnia Comments: Encouraged to take melatonin as needed and to consider taking magnesium   6. Encounter for hepatitis C screening test for low risk patient Will check Hepatitis C screening due to recent recommendations to screen all adults 18 years and older - Hepatitis C antibody  7. Encounter for HIV (human immunodeficiency virus) test - HIV antibody (with reflex)  8. History of allergic reaction - EPINEPHrine (EPIPEN 2-PAK) 0.3 mg/0.3 mL IJ SOAJ injection; Inject 0.3 mg into the muscle once for 1 dose.  Dispense: 1 each; Refill: 0  9. History of COVID-19 Comments: Doing well since having Covid.   10. Screening for colon cancer According to USPTF Colorectal cancer Screening guidelines. Cologuard is recommended every 3 years, starting at age 49 years. Order for cologuard sent - Cologuard  11. Encounter to establish care with new doctor     Patient was given opportunity to ask questions. Patient verbalized understanding of the plan and was able to repeat key elements of the plan. All questions were answered to their satisfaction.  Minette Brine, FNP   I, Minette Brine, FNP, have reviewed all documentation for this visit. The documentation on 11/18/21 for the exam, diagnosis, procedures, and orders are all accurate and complete.   IF YOU HAVE BEEN REFERRED TO A SPECIALIST, IT MAY TAKE 1-2 WEEKS TO SCHEDULE/PROCESS THE REFERRAL. IF YOU HAVE NOT  HEARD FROM US/SPECIALIST IN TWO WEEKS, PLEASE GIVE [place] A CALL AT [phone].   THE PATIENT IS ENCOURAGED TO PRACTICE SOCIAL DISTANCING DUE TO THE COVID-19 PANDEMIC.

## 2021-11-18 NOTE — Patient Instructions (Signed)
Health Maintenance, Male Adopting a healthy lifestyle and getting preventive care are important in promoting health and wellness. Ask your health care provider about: The right schedule for you to have regular tests and exams. Things you can do on your own to prevent diseases and keep yourself healthy. What should I know about diet, weight, and exercise? Eat a healthy diet  Eat a diet that includes plenty of vegetables, fruits, low-fat dairy products, and lean protein. Do not eat a lot of foods that are high in solid fats, added sugars, or sodium. Maintain a healthy weight Body mass index (BMI) is a measurement that can be used to identify possible weight problems. It estimates body fat based on height and weight. Your health care provider can help determine your BMI and help you achieve or maintain a healthy weight. Get regular exercise Get regular exercise. This is one of the most important things you can do for your health. Most adults should: Exercise for at least 150 minutes each week. The exercise should increase your heart rate and make you sweat (moderate-intensity exercise). Do strengthening exercises at least twice a week. This is in addition to the moderate-intensity exercise. Spend less time sitting. Even light physical activity can be beneficial. Watch cholesterol and blood lipids Have your blood tested for lipids and cholesterol at 47 years of age, then have this test every 5 years. You may need to have your cholesterol levels checked more often if: Your lipid or cholesterol levels are high. You are older than 47 years of age. You are at high risk for heart disease. What should I know about cancer screening? Many types of cancers can be detected early and may often be prevented. Depending on your health history and family history, you may need to have cancer screening at various ages. This may include screening for: Colorectal cancer. Prostate cancer. Skin cancer. Lung  cancer. What should I know about heart disease, diabetes, and high blood pressure? Blood pressure and heart disease High blood pressure causes heart disease and increases the risk of stroke. This is more likely to develop in people who have high blood pressure readings or are overweight. Talk with your health care provider about your target blood pressure readings. Have your blood pressure checked: Every 3-5 years if you are 18-39 years of age. Every year if you are 40 years old or older. If you are between the ages of 65 and 75 and are a current or former smoker, ask your health care provider if you should have a one-time screening for abdominal aortic aneurysm (AAA). Diabetes Have regular diabetes screenings. This checks your fasting blood sugar level. Have the screening done: Once every three years after age 45 if you are at a normal weight and have a low risk for diabetes. More often and at a younger age if you are overweight or have a high risk for diabetes. What should I know about preventing infection? Hepatitis B If you have a higher risk for hepatitis B, you should be screened for this virus. Talk with your health care provider to find out if you are at risk for hepatitis B infection. Hepatitis C Blood testing is recommended for: Everyone born from 1945 through 1965. Anyone with known risk factors for hepatitis C. Sexually transmitted infections (STIs) You should be screened each year for STIs, including gonorrhea and chlamydia, if: You are sexually active and are younger than 47 years of age. You are older than 47 years of age and your   health care provider tells you that you are at risk for this type of infection. Your sexual activity has changed since you were last screened, and you are at increased risk for chlamydia or gonorrhea. Ask your health care provider if you are at risk. Ask your health care provider about whether you are at high risk for HIV. Your health care provider  may recommend a prescription medicine to help prevent HIV infection. If you choose to take medicine to prevent HIV, you should first get tested for HIV. You should then be tested every 3 months for as long as you are taking the medicine. Follow these instructions at home: Alcohol use Do not drink alcohol if your health care provider tells you not to drink. If you drink alcohol: Limit how much you have to 0-2 drinks a day. Know how much alcohol is in your drink. In the [place] one drink equals one 12 oz bottle of beer (355 mL), one 5 oz glass of wine (148 mL), or one 1 oz glass of hard liquor (44 mL). Lifestyle Do not use any products that contain nicotine or tobacco. These products include cigarettes, chewing tobacco, and vaping devices, such as e-cigarettes. If you need help quitting, ask your health care provider. Do not use street drugs. Do not share needles. Ask your health care provider for help if you need support or information about quitting drugs. General instructions Schedule regular health, dental, and eye exams. Stay current with your vaccines. Tell your health care provider if: You often feel depressed. You have ever been abused or do not feel safe at home. Summary Adopting a healthy lifestyle and getting preventive care are important in promoting health and wellness. Follow your health care provider's instructions about healthy diet, exercising, and getting tested or screened for diseases. Follow your health care provider's instructions on monitoring your cholesterol and blood pressure. This information is not intended to replace advice given to you by your health care provider. Make sure you discuss any questions you have with your health care provider. Document Revised: 07/08/2020 Document Reviewed: 07/08/2020 Elsevier Patient Education  2023 [employer]  

## 2021-11-19 LAB — LIPID PANEL
Chol/HDL Ratio: 3 ratio (ref 0.0–5.0)
Cholesterol, Total: 214 mg/dL — ABNORMAL HIGH (ref 100–199)
HDL: 72 mg/dL (ref >39)
LDL Chol Calc (NIH): 127 mg/dL — ABNORMAL HIGH (ref 0–99)
Triglycerides: 87 mg/dL (ref 0–149)
VLDL Cholesterol Cal: 15 mg/dL (ref 5–40)

## 2021-11-19 LAB — CMP14+EGFR
ALT: 33 IU/L (ref 0–44)
AST: 25 IU/L (ref 0–40)
Albumin/Globulin Ratio: 1.4 (ref 1.2–2.2)
Albumin: 4.5 g/dL (ref 4.1–5.1)
Alkaline Phosphatase: 121 IU/L (ref 44–121)
BUN/Creatinine Ratio: 8 — ABNORMAL LOW (ref 9–20)
BUN: 11 mg/dL (ref 6–24)
Bilirubin Total: 1.6 mg/dL — ABNORMAL HIGH (ref 0.0–1.2)
CO2: 28 mmol/L (ref 20–29)
Calcium: 9.9 mg/dL (ref 8.7–10.2)
Chloride: 100 mmol/L (ref 96–106)
Creatinine, Ser: 1.3 mg/dL — ABNORMAL HIGH (ref 0.76–1.27)
Globulin, Total: 3.2 g/dL (ref 1.5–4.5)
Glucose: 88 mg/dL (ref 70–99)
Potassium: 4.5 mmol/L (ref 3.5–5.2)
Sodium: 139 mmol/L (ref 134–144)
Total Protein: 7.7 g/dL (ref 6.0–8.5)
eGFR: 68 mL/min/{1.73_m2} (ref >59)

## 2021-11-19 LAB — HIV ANTIBODY (ROUTINE TESTING W REFLEX): HIV Screen 4th Generation wRfx: NONREACTIVE

## 2021-11-19 LAB — CBC
Hematocrit: 42.1 % (ref 37.5–51.0)
Hemoglobin: 13.8 g/dL (ref 13.0–17.7)
MCH: 28.3 pg (ref 26.6–33.0)
MCHC: 32.8 g/dL (ref 31.5–35.7)
MCV: 86 fL (ref 79–97)
Platelets: 279 10*3/uL (ref 150–450)
RBC: 4.87 x10E6/uL (ref 4.14–5.80)
RDW: 11 % — ABNORMAL LOW (ref 11.6–15.4)
WBC: 4 10*3/uL (ref 3.4–10.8)

## 2021-11-19 LAB — TSH: TSH: 1.97 u[IU]/mL (ref 0.450–4.500)

## 2021-11-19 LAB — HEPATITIS C ANTIBODY: Hep C Virus Ab: NONREACTIVE

## 2021-11-19 LAB — TESTOSTERONE: Testosterone: 534 ng/dL (ref 264–916)

## 2021-11-25 ENCOUNTER — Other Ambulatory Visit: Payer: Self-pay | Admitting: Nurse Practitioner

## 2021-11-25 DIAGNOSIS — K640 First degree hemorrhoids: Secondary | ICD-10-CM

## 2021-11-25 MED ORDER — PRAMOXINE-HC 1-2.5 % EX OINT: 1.0000 | TOPICAL_OINTMENT | Freq: Two times a day (BID) | CUTANEOUS | 5 refills | Status: AC | PRN

## 2021-11-27 ENCOUNTER — Encounter: Payer: Self-pay | Admitting: Nurse Practitioner

## 2021-12-04 LAB — COLOGUARD: COLOGUARD: NEGATIVE

## 2021-12-08 ENCOUNTER — Other Ambulatory Visit: Payer: Self-pay

## 2022-01-20 ENCOUNTER — Ambulatory Visit (INDEPENDENT_AMBULATORY_CARE_PROVIDER_SITE_OTHER): Payer: Commercial Managed Care - HMO | Admitting: Nurse Practitioner

## 2022-01-20 ENCOUNTER — Encounter: Payer: Self-pay | Admitting: Nurse Practitioner

## 2022-01-20 VITALS — BP 118/68 | HR 81 | Temp 98.5°F | Ht 66.0 in | Wt 182.2 lb

## 2022-01-20 DIAGNOSIS — F419 Anxiety disorder, unspecified: Secondary | ICD-10-CM

## 2022-01-20 DIAGNOSIS — Z2821 Immunization not carried out because of patient refusal: Secondary | ICD-10-CM

## 2022-01-20 DIAGNOSIS — F321 Major depressive disorder, single episode, moderate: Secondary | ICD-10-CM | POA: Diagnosis not present

## 2022-01-20 DIAGNOSIS — F5101 Primary insomnia: Secondary | ICD-10-CM

## 2022-01-20 MED ORDER — CITALOPRAM HYDROBROMIDE 10 MG PO TABS: 10.0000 mg | ORAL_TABLET | Freq: Every day | ORAL | 2 refills | Status: DC

## 2022-01-20 NOTE — Patient Instructions (Addendum)
Depression Screening Depression screening is a tool that your health care provider can use to learn if you have symptoms of depression. Depression is a common condition with many symptoms that are also often found in other conditions. Depression is treatable, but it must first be diagnosed. You may not know that certain feelings, thoughts, and behaviors that you are having can be symptoms of depression. Taking a depression screening test can help you and your health care provider decide if you need more assessment, or if you should be referred to a mental health care provider. What are the screening tests? You may have a physical exam to see if another condition is affecting your mental health. You may have a blood or urine sample taken during the physical exam. You may be interviewed or offered a written test using a screening tool that was developed from research, such as one of these: Patient Health Questionnaire (PHQ). This is a set of either 2 or 9 questions. A health care provider who has been trained to score this screening test uses a guide to assess if your symptoms suggest that you may have depression. Hamilton Depression Rating Scale (HAM-D). This is a set of either 17 or 24 questions. You may be asked to take it again during or after your treatment, to see if your depression has gotten better. Beck Depression Inventory (BDI). This is a set of 21 multiple choice questions. Your health care provider scores your answers to assess: Your level of depression, ranging from mild to severe. Your response to treatment. Your health care provider may talk with you about your daily activities, such as eating, sleeping, work, and recreation, and ask if you have had any changes in activity. Your health care provider may ask you to see a mental health specialist, such as a psychiatrist or psychologist, for more evaluation. Who should be screened for depression?  All adults, including adults with a family  history of a mental health disorder. People who are 10-21 years old. People who are recovering from an acute condition, such as myocardial infarction (MI) or stroke. Pregnant women, or women who have given birth. People who have a long-term (chronic) illness. Anyone who has been diagnosed with another type of mental health disorder. Anyone who has symptoms that could show depression. What do my results mean? Your health care provider will review the results of your depression screening, physical exam, and lab tests. Positive screens suggest that you may have depression. Screening is the first step in getting the care that you may need. It will be important for you to know the results of your tests. Ask your health care provider, or the department that is doing your screening tests, when your results will be ready. Talk with your health care provider about your results, diagnosis, and recommendations for follow-up. A diagnosis of depression is made using information from the Diagnostic and Statistical Manual of Mental Disorders (DSM-5). This is a book that lists the number and type of symptoms that must be present for a health care provider to give a specific diagnosis. Your health care provider may work with you to treat your symptoms of depression, or your health care provider may help you find a mental health provider who can assess and help you develop a plan to treat your depression. Get help right away if: You have thoughts about hurting yourself or others. If you ever feel like you may hurt yourself or others, or have thoughts about taking your own life,   get help right away. Go to your nearest emergency department or: Call your local emergency services (911 in the [place] Call a suicide crisis helpline, such as the National Suicide Prevention Lifeline at 6048492725 or 988 in the [place] This is open 24 hours a day in the [place] Text the Crisis Text Line at (320) 204-5893 (in the  [place] Summary Depression screening is the first step in getting the help that you may need. If your screening test shows symptoms of depression (is positive), your health care provider may ask you to see a mental health provider who will help identify ways to treat your depression. Anyone aged 40 or older should be screened for depression. This information is not intended to replace advice given to you by your health care provider. Make sure you discuss any questions you have with your health care provider. Document Revised: 09/11/2020 Document Reviewed: 05/27/2020 Elsevier Patient Education  2023 Elsevier Inc.   Citalopram Tablets What is this medication? CITALOPRAM (sye TAL oh pram) treats depression. It increases the amount of serotonin in the brain, a hormone that helps regulate mood. It belongs to a group of medications called SSRIs. This medicine may be used for other purposes; ask your health care provider or pharmacist if you have questions. COMMON BRAND NAME(S): Celexa What should I tell my care team before I take this medication? They need to know if you have any of these conditions: Bipolar disorder or a family history of bipolar disorder Bleeding disorders Glaucoma Heart disease History of irregular heartbeat Kidney disease Liver disease Low levels of magnesium or potassium in the blood Receiving electroconvulsive therapy Seizures Suicidal thoughts, plans, or attempt; a previous suicide attempt by you or a family member Take medications that treat or prevent blood clots Thyroid disease An unusual or allergic reaction to citalopram, escitalopram, other medications, foods, dyes, or preservatives Pregnant or trying to become pregnant Breast-feeding How should I use this medication? Take this medication by mouth with a glass of water. Follow the directions on the prescription label. You can take it with or without food. Take your medication at regular intervals. Do not  take your medication more often than directed. Do not stop taking this medication suddenly except upon the advice of your care team. Stopping this medication too quickly may cause serious side effects or your condition may worsen. A special MedGuide will be given to you by the pharmacist with each prescription and refill. Be sure to read this information carefully each time. Talk to your care team about the use of this medication in children. Special care may be needed. Patients over 76 years old may have a stronger reaction and need a smaller dose. Overdosage: If you think you have taken too much of this medicine contact a poison control center or emergency room at once. NOTE: This medicine is only for you. Do not share this medicine with others. What if I miss a dose? If you miss a dose, take it as soon as you can. If it is almost time for your next dose, take only that dose. Do not take double or extra doses. What may interact with this medication? Do not take this medication with any of the following: Certain medications for fungal infections like fluconazole, itraconazole, ketoconazole, posaconazole, voriconazole Cisapride Dronedarone Escitalopram Linezolid MAOIs like Carbex, Eldepryl, Marplan, Nardil, and Parnate Methylene blue (injected into a vein) Pimozide Thioridazine This medication may also interact with the following: Alcohol Amphetamines Aspirin and aspirin-like medications Carbamazepine Certain medications for depression,  anxiety, or psychotic disturbances Certain medications for infections like chloroquine, clarithromycin, erythromycin, furazolidone, isoniazid, pentamidine Certain medications for migraine headaches like almotriptan, eletriptan, frovatriptan, naratriptan, rizatriptan, sumatriptan, zolmitriptan Certain medications for sleep Certain medications that treat or prevent blood clots like dalteparin, enoxaparin,  warfarin Cimetidine Diuretics Dofetilide Fentanyl Lithium Methadone Metoprolol NSAIDs, medications for pain and inflammation, like ibuprofen or naproxen Omeprazole Other medications that prolong the QT interval (cause an abnormal heart rhythm) Procarbazine Rasagiline Supplements like St. John's wort, kava kava, valerian Tramadol Tryptophan Ziprasidone This list may not describe all possible interactions. Give your health care provider a list of all the medicines, herbs, non-prescription drugs, or dietary supplements you use. Also tell them if you smoke, drink alcohol, or use illegal drugs. Some items may interact with your medicine. What should I watch for while using this medication? Tell your care team if your symptoms do not get better or if they get worse. Visit your care team for regular checks on your progress. Because it may take several weeks to see the full effects of this medication, it is important to continue your treatment as prescribed. Watch for new or worsening thoughts of suicide or depression. This includes sudden changes in mood, behavior, or thoughts. These changes can happen at any time but are more common in the beginning of treatment or after a change in dose. Call your care team right away if you experience these thoughts or worsening depression. Manic episodes may happen in patients with bipolar disorder who take this medication. Watch for changes in feelings or behaviors such as feeling anxious, nervous, agitated, panicky, irritable, hostile, aggressive, impulsive, severely restless, overly excited and hyperactive, or trouble sleeping. These symptoms can happen at anytime but are more common in the beginning of treatment or after a change in dose. Call you care team right away if you notice any of these symptoms. You may get drowsy or dizzy. Do not drive, use machinery, or do anything that needs mental alertness until you know how this medication affects you. Do not  stand or sit up quickly, especially if you are an older patient. This reduces the risk of dizzy or fainting spells. Alcohol may interfere with the effect of this medication. Avoid alcoholic drinks. Your mouth may get dry. Chewing sugarless gum or sucking hard candy, and drinking plenty of water may help. Contact your care team if the problem does not go away or is severe. What side effects may I notice from receiving this medication? Side effects that you should report to your care team as soon as possible: Allergic reactions--skin rash, itching, hives, swelling of the face, lips, tongue, or throat Bleeding--bloody or black, tar-like stools, red or dark brown urine, vomiting blood or brown material that looks like coffee grounds, small, red or purple spots on skin, unusual bleeding or bruising Heart rhythm changes--fast or irregular heartbeat, dizziness, feeling faint or lightheaded, chest pain, trouble breathing Low sodium level--muscle weakness, fatigue, dizziness, headache, confusion Serotonin syndrome--irritability, confusion, fast or irregular heartbeat, muscle stiffness, twitching muscles, sweating, high fever, seizure, chills, vomiting, diarrhea Sudden eye pain or change in vision such as blurry vision, seeing halos around lights, vision loss Thoughts of suicide or self-harm, worsening mood, feelings of depression Side effects that usually do not require medical attention (report to your care team if they continue or are bothersome): Change in sex drive or performance Diarrhea Dry mouth Excessive sweating Nausea Tremors or shaking Upset stomach This list may not describe all possible side effects. Call your  doctor for medical advice about side effects. You may report side effects to FDA at 1-800-FDA-1088. Where should I keep my medication? Keep out of reach of children and pets. Store at room temperature between 15 and 30 degrees C (59 and 86 degrees F). Throw away any unused medication  after the expiration date. NOTE: This sheet is a summary. It may not cover all possible information. If you have questions about this medicine, talk to your doctor, pharmacist, or health care provider.  2023 Elsevier/Gold Standard (2020-02-19 00:00:00)

## 2022-01-20 NOTE — Progress Notes (Signed)
Barnet Glasgow Martin,acting as a Education administrator for Minette Brine, FNP.,have documented all relevant documentation on the behalf of Minette Brine, FNP,as directed by  Minette Brine, FNP while in the presence of Minette Brine, [place].    Subjective:     Patient ID: Joshua Sims , male    DOB: [birth year] , 46 y.o.   MRN: [id number]   Chief Complaint  Patient presents with   Depression    HPI  Pt presents today for depression f/u. Patient states compliance with medications and has no other issues today.   Patient states he is has went to the urologist and has to have a follow up.    Patient states he has mild depression some days, averaging about 2 out of 7 days of the week. He states he sleeps to cope with it. Patient states he would like to consider a low dose of depression medication for now.  He is sleeping better after taking magnesium   BP Readings from Last 3 Encounters: 01/20/22 : 118/68 11/18/21 : 120/80 07/03/21 : (!) 145/88    Depression        This is a recurrent problem.  The current episode started more than 1 month ago.   The onset quality is gradual.   Associated symptoms include fatigue and insomnia.  Associated symptoms include no decreased concentration.  Past treatments include nothing.  Risk factors include stress.    Pertinent negatives include no anxiety.    Past Medical History:  Diagnosis Date   Allergy    Anxiety    Depression      Family History  Problem Relation Age of Onset   Sudden death Neg Hx    Hypertension Neg Hx    Hyperlipidemia Neg Hx    Heart attack Neg Hx    Diabetes Neg Hx      Current Outpatient Medications:    Ashwagandha 120 MG CAPS, Take 1 tablet by mouth at bedtime., Disp: 30 capsule, Rfl: 2   citalopram (CELEXA) 10 MG tablet, Take 1 tablet (10 mg total) by mouth daily., Disp: 30 tablet, Rfl: 2   Magnesium 400 MG TABS, Take 1 tablet by mouth every evening., Disp: 30 tablet, Rfl: 2   melatonin 5 MG TABS, Take 1 tablet (5 mg total) by  mouth at bedtime as needed., Disp: 90 tablet, Rfl: 1   Pramoxine-HC 1-2.5 % OINT, Apply 1 each topically 2 (two) times daily as needed., Disp: 28.4 g, Rfl: 5   No Known Allergies   Review of Systems  Constitutional:  Positive for fatigue.  HENT: Negative.    Eyes: Negative.   Respiratory: Negative.    Cardiovascular: Negative.   Gastrointestinal: Negative.   Musculoskeletal: Negative.   Skin: Negative.   Allergic/Immunologic: Negative.   Neurological: Negative.   Psychiatric/Behavioral:  Positive for depression. Negative for decreased concentration. The patient has insomnia.      Today's Vitals   01/20/22 1619  BP: 118/68  Pulse: 81  Temp: 98.5 F (36.9 C)  TempSrc: Oral  Weight: 182 lb 3.2 oz (82.6 kg)  Height: 5\' 6"  (1.676 m)  PainSc: 0-No pain   Body mass index is 29.41 kg/m.  Wt Readings from Last 3 Encounters:  01/20/22 182 lb 3.2 oz (82.6 kg)  11/18/21 175 lb 6.4 oz (79.6 kg)  08/02/15 182 lb (82.6 kg)    Objective:  Physical Exam Vitals reviewed.  Constitutional:      General: He is not in acute distress.    Appearance:  Normal appearance. He is obese.  HENT:     Head: Normocephalic.  Cardiovascular:     Rate and Rhythm: Normal rate and regular rhythm.     Pulses: Normal pulses.     Heart sounds: Normal heart sounds. No murmur heard. Pulmonary:     Effort: Pulmonary effort is normal. No respiratory distress.     Breath sounds: Normal breath sounds. No wheezing.  Skin:    General: Skin is warm and dry.     Capillary Refill: Capillary refill takes less than 2 seconds.  Neurological:     General: No focal deficit present.     Mental Status: He is alert and oriented to person, place, and time.     Cranial Nerves: No cranial nerve deficit.     Motor: No weakness.  Psychiatric:        Mood and Affect: Mood normal.        Behavior: Behavior normal.        Thought Content: Thought content normal.        Judgment: Judgment normal.         Assessment  And Plan:     1. Current moderate episode of major depressive disorder without prior episode (HCC) Comments: Will start him on low dose citalopram, return to office in 4 weeks for medication f/u - citalopram (CELEXA) 10 MG tablet; Take 1 tablet (10 mg total) by mouth daily.  Dispense: 30 tablet; Refill: 2  2. Anxiety  3. Primary insomnia Comments: Encouraged to take magnesium and ashwaganda nightly  4. COVID-19 vaccination declined Declines covid 19 vaccine. Discussed risk of covid 72 and if he changes her mind about the vaccine to call the office. Education has been provided regarding the importance of this vaccine but patient still declined. Advised may receive this vaccine at local pharmacy or Health Dept.or vaccine clinic. Aware to provide a copy of the vaccination record if obtained from local pharmacy or Health Dept.  Encouraged to take multivitamin, vitamin d, vitamin c and zinc to increase immune system. Aware can call office if would like to have vaccine here at office. Verbalized acceptance and understanding. I have expressed the covid numbers are increasing and to wear mask when possible around other people    Patient was given opportunity to ask questions. Patient verbalized understanding of the plan and was able to repeat key elements of the plan. All questions were answered to their satisfaction.  Arnette Felts, FNP   I, Arnette Felts, FNP, have reviewed all documentation for this visit. The documentation on 02/04/22 for the exam, diagnosis, procedures, and orders are all accurate and complete.   IF YOU HAVE BEEN REFERRED TO A SPECIALIST, IT MAY TAKE 1-2 WEEKS TO SCHEDULE/PROCESS THE REFERRAL. IF YOU HAVE NOT HEARD FROM US/SPECIALIST IN TWO WEEKS, PLEASE GIVE [place] A CALL AT [phone] X [phone].   THE PATIENT IS ENCOURAGED TO PRACTICE SOCIAL DISTANCING DUE TO THE COVID-19 PANDEMIC.

## 2022-03-04 ENCOUNTER — Ambulatory Visit (INDEPENDENT_AMBULATORY_CARE_PROVIDER_SITE_OTHER): Payer: 59 | Admitting: Nurse Practitioner

## 2022-03-04 ENCOUNTER — Encounter: Payer: Self-pay | Admitting: Nurse Practitioner

## 2022-03-04 VITALS — BP 126/72 | HR 72 | Temp 98.1°F | Ht 66.0 in | Wt 186.0 lb

## 2022-03-04 DIAGNOSIS — K029 Dental caries, unspecified: Secondary | ICD-10-CM

## 2022-03-04 DIAGNOSIS — Z0000 Encounter for general adult medical examination without abnormal findings: Secondary | ICD-10-CM

## 2022-03-04 DIAGNOSIS — F419 Anxiety disorder, unspecified: Secondary | ICD-10-CM

## 2022-03-04 DIAGNOSIS — G4452 New daily persistent headache (NDPH): Secondary | ICD-10-CM

## 2022-03-04 DIAGNOSIS — F5101 Primary insomnia: Secondary | ICD-10-CM

## 2022-03-04 DIAGNOSIS — F321 Major depressive disorder, single episode, moderate: Secondary | ICD-10-CM

## 2022-03-04 DIAGNOSIS — Z6830 Body mass index (BMI) 30.0-30.9, adult: Secondary | ICD-10-CM

## 2022-03-04 DIAGNOSIS — E6609 Other obesity due to excess calories: Secondary | ICD-10-CM

## 2022-03-04 DIAGNOSIS — H6121 Impacted cerumen, right ear: Secondary | ICD-10-CM

## 2022-03-04 DIAGNOSIS — Z79899 Other long term (current) drug therapy: Secondary | ICD-10-CM

## 2022-03-04 DIAGNOSIS — E7800 Pure hypercholesterolemia, unspecified: Secondary | ICD-10-CM

## 2022-03-04 MED ORDER — CETIRIZINE HCL 10 MG PO TABS: 10.0000 mg | ORAL_TABLET | Freq: Every day | ORAL | 2 refills | Status: AC

## 2022-03-04 MED ORDER — FLUTICASONE PROPIONATE 50 MCG/ACT NA SUSP: 2.0000 | Freq: Every day | NASAL | 2 refills | Status: DC

## 2022-03-04 NOTE — Progress Notes (Signed)
I,Tianna Badgett,acting as a Education administrator for [employer], FNP.,have documented all relevant documentation on the behalf of Minette Brine, FNP,as directed by  Minette Brine, FNP while in the presence of Minette Brine, [place].  Subjective:     Patient ID: Joshua Sims , male    DOB: February 17, 1976 , 47 y.o.   MRN: [id number]   Chief Complaint  Patient presents with   Annual Exam    HPI  Patient presents today for HM. He is followed by Urology at this time.   Wt Readings from Last 3 Encounters: 03/04/22 : 186 lb (84.4 kg) 01/20/22 : 182 lb 3.2 oz (82.6 kg) 11/18/21 : 175 lb 6.4 oz (79.6 kg)       Past Medical History:  Diagnosis Date   Allergy    Anxiety    Depression      Family History  Problem Relation Age of Onset   Sudden death Neg Hx    Hypertension Neg Hx    Hyperlipidemia Neg Hx    Heart attack Neg Hx    Diabetes Neg Hx      Current Outpatient Medications:    Ashwagandha 120 MG CAPS, Take 1 tablet by mouth at bedtime., Disp: 30 capsule, Rfl: 2   cetirizine (ZYRTEC ALLERGY) 10 MG tablet, Take 1 tablet (10 mg total) by mouth daily., Disp: 30 tablet, Rfl: 2   citalopram (CELEXA) 10 MG tablet, Take 1 tablet (10 mg total) by mouth daily., Disp: 30 tablet, Rfl: 2   fluticasone (FLONASE) 50 MCG/ACT nasal spray, Place 2 sprays into both nostrils daily., Disp: 16 g, Rfl: 2   Magnesium 400 MG TABS, Take 1 tablet by mouth every evening., Disp: 30 tablet, Rfl: 2   melatonin 5 MG TABS, Take 1 tablet (5 mg total) by mouth at bedtime as needed., Disp: 90 tablet, Rfl: 1   Pramoxine-HC 1-2.5 % OINT, Apply 1 each topically 2 (two) times daily as needed., Disp: 28.4 g, Rfl: 5   No Known Allergies   Men's preventive visit. Patient Health Questionnaire (PHQ-2) is  [place] Office Visit from 03/04/2022 in [facility]  PHQ-2 Total Score 1     Patient is on a Regular diet. Exercising - none. He is considering a [place]. Marital status: Married. Relevant history  for alcohol use is:  Social History   Substance and Sexual Activity  Alcohol Use Yes   Comment: socially   Relevant history for tobacco use is:  Social History   Tobacco Use  Smoking Status Never  Smokeless Tobacco Not on file  .   Review of Systems  Constitutional: Negative.   HENT: Negative.    Eyes: Negative.   Respiratory: Negative.    Cardiovascular: Negative.   Gastrointestinal: Negative.   Endocrine: Negative.   Genitourinary: Negative.   Musculoskeletal: Negative.   Skin: Negative.   Allergic/Immunologic: Negative.   Neurological: Negative.   Hematological: Negative.   Psychiatric/Behavioral: Negative.       Today's Vitals   03/04/22 1151  BP: 126/72  Pulse: 72  Temp: 98.1 F (36.7 C)  TempSrc: Oral  Weight: 186 lb (84.4 kg)  Height: _0  (1.676 m)   Body mass index is 30.02 kg/m.  Wt Readings from Last 3 Encounters:  03/04/22 186 lb (84.4 kg)  01/20/22 182 lb 3.2 oz (82.6 kg)  11/18/21 175 lb 6.4 oz (79.6 kg)    Objective:  Physical Exam Vitals reviewed.  Constitutional:      Appearance: Normal appearance. He  is obese.  HENT:     Head: Normocephalic and atraumatic.     Right Ear: External ear normal. There is impacted cerumen.     Left Ear: Tympanic membrane, ear canal and external ear normal. There is no impacted cerumen.     Nose: Nose normal.     Mouth/Throat:     Mouth: Mucous membranes are moist.     Comments: Right lower molar with decay Eyes:     Extraocular Movements: Extraocular movements intact.     Conjunctiva/sclera: Conjunctivae normal.     Pupils: Pupils are equal, round, and reactive to light.  Cardiovascular:     Rate and Rhythm: Normal rate and regular rhythm.     Pulses: Normal pulses.     Heart sounds: Normal heart sounds. No murmur heard. Pulmonary:     Effort: Pulmonary effort is normal. No respiratory distress.     Breath sounds: Normal breath sounds.  Abdominal:     General: Abdomen is flat. Bowel sounds are  normal. There is no distension.     Palpations: Abdomen is soft.  Genitourinary:    Prostate: Normal.     Rectum: Guaiac result negative.  Musculoskeletal:        General: Normal range of motion.     Cervical back: Normal range of motion and neck supple.  Skin:    General: Skin is warm.     Capillary Refill: Capillary refill takes less than 2 seconds.  Neurological:     General: No focal deficit present.     Mental Status: He is alert and oriented to person, place, and time.  Psychiatric:        Mood and Affect: Mood normal.        Behavior: Behavior normal.        Thought Content: Thought content normal.        Judgment: Judgment normal.         Assessment And Plan:    1. Encounter for annual physical exam Behavior modifications discussed and diet history reviewed.   Pt will continue to exercise regularly and modify diet with low GI, plant based foods and decrease intake of processed foods.  Recommend intake of daily multivitamin, Vitamin D, and calcium.  Recommend colonoscopy for preventive screenings, as well as recommend immunizations that include influenza, TDAP.   2. Class 1 obesity due to excess calories without serious comorbidity with body mass index (BMI) of 30.0 to 30.9 in adult She is encouraged to strive for BMI less than 30 to decrease cardiac risk. Advised to aim for at least 150 minutes of exercise per week. - Hemoglobin A1c  3. Other long term (current) drug therapy - CBC  4. Current moderate episode of major depressive disorder without prior episode [facility]) Comments: She is doing well with current medications.  5. Anxiety Comments: Continue current medications, doing well.  6. Primary insomnia Comments: Doing well, continue current medications  7. Elevated cholesterol Comments: Will check lipid panel, pending labs will start medications. Discussed risk for heart disease. - CMP14+EGFR - Lipid panel  8. New daily persistent headache Comments: Likely  related to seasonal allergies will trial on zyrtec and flonase. - cetirizine (ZYRTEC ALLERGY) 10 MG tablet; Take 1 tablet (10 mg total) by mouth daily.  Dispense: 30 tablet; Refill: 2 - fluticasone (FLONASE) 50 MCG/ACT nasal spray; Place 2 sprays into both nostrils daily.  Dispense: 16 g; Refill: 2  9. Impacted cerumen of right ear Wax is removed by with lavage  with elephant ear with 1/2 water and 1/2 peroxide. Instructions for home care to prevent wax buildup are given. - Ear Lavage  10. Dental caries Comments: right lower molar with dental carie, advised to contact his dentist   Patient was given opportunity to ask questions. Patient verbalized understanding of the plan and was able to repeat key elements of the plan. All questions were answered to their satisfaction.   Minette Brine, FNP   I, Minette Brine, FNP, have reviewed all documentation for this visit. The documentation on 03/04/22 for the exam, diagnosis, procedures, and orders are all accurate and complete.   THE PATIENT IS ENCOURAGED TO PRACTICE SOCIAL DISTANCING DUE TO THE COVID-19 PANDEMIC.

## 2022-03-04 NOTE — Patient Instructions (Signed)
Health Maintenance, Male Adopting a healthy lifestyle and getting preventive care are important in promoting health and wellness. Ask your health care provider about: The right schedule for you to have regular tests and exams. Things you can do on your own to prevent diseases and keep yourself healthy. What should I know about diet, weight, and exercise? Eat a healthy diet  Eat a diet that includes plenty of vegetables, fruits, low-fat dairy products, and lean protein. Do not eat a lot of foods that are high in solid fats, added sugars, or sodium. Maintain a healthy weight Body mass index (BMI) is a measurement that can be used to identify possible weight problems. It estimates body fat based on height and weight. Your health care provider can help determine your BMI and help you achieve or maintain a healthy weight. Get regular exercise Get regular exercise. This is one of the most important things you can do for your health. Most adults should: Exercise for at least 150 minutes each week. The exercise should increase your heart rate and make you sweat (moderate-intensity exercise). Do strengthening exercises at least twice a week. This is in addition to the moderate-intensity exercise. Spend less time sitting. Even light physical activity can be beneficial. Watch cholesterol and blood lipids Have your blood tested for lipids and cholesterol at 48 years of age, then have this test every 5 years. You may need to have your cholesterol levels checked more often if: Your lipid or cholesterol levels are high. You are older than 48 years of age. You are at high risk for heart disease. What should I know about cancer screening? Many types of cancers can be detected early and may often be prevented. Depending on your health history and family history, you may need to have cancer screening at various ages. This may include screening for: Colorectal cancer. Prostate cancer. Skin cancer. Lung  cancer. What should I know about heart disease, diabetes, and high blood pressure? Blood pressure and heart disease High blood pressure causes heart disease and increases the risk of stroke. This is more likely to develop in people who have high blood pressure readings or are overweight. Talk with your health care provider about your target blood pressure readings. Have your blood pressure checked: Every 3-5 years if you are 18-39 years of age. Every year if you are 40 years old or older. If you are between the ages of 65 and 75 and are a current or former smoker, ask your health care provider if you should have a one-time screening for abdominal aortic aneurysm (AAA). Diabetes Have regular diabetes screenings. This checks your fasting blood sugar level. Have the screening done: Once every three years after age 45 if you are at a normal weight and have a low risk for diabetes. More often and at a younger age if you are overweight or have a high risk for diabetes. What should I know about preventing infection? Hepatitis B If you have a higher risk for hepatitis B, you should be screened for this virus. Talk with your health care provider to find out if you are at risk for hepatitis B infection. Hepatitis C Blood testing is recommended for: Everyone born from 1945 through 1965. Anyone with known risk factors for hepatitis C. Sexually transmitted infections (STIs) You should be screened each year for STIs, including gonorrhea and chlamydia, if: You are sexually active and are younger than 48 years of age. You are older than 48 years of age and your   health care provider tells you that you are at risk for this type of infection. Your sexual activity has changed since you were last screened, and you are at increased risk for chlamydia or gonorrhea. Ask your health care provider if you are at risk. Ask your health care provider about whether you are at high risk for HIV. Your health care provider  may recommend a prescription medicine to help prevent HIV infection. If you choose to take medicine to prevent HIV, you should first get tested for HIV. You should then be tested every 3 months for as long as you are taking the medicine. Follow these instructions at home: Alcohol use Do not drink alcohol if your health care provider tells you not to drink. If you drink alcohol: Limit how much you have to 0-2 drinks a day. Know how much alcohol is in your drink. In the [place] one drink equals one 12 oz bottle of beer (355 mL), one 5 oz glass of wine (148 mL), or one 1 oz glass of hard liquor (44 mL). Lifestyle Do not use any products that contain nicotine or tobacco. These products include cigarettes, chewing tobacco, and vaping devices, such as e-cigarettes. If you need help quitting, ask your health care provider. Do not use street drugs. Do not share needles. Ask your health care provider for help if you need support or information about quitting drugs. General instructions Schedule regular health, dental, and eye exams. Stay current with your vaccines. Tell your health care provider if: You often feel depressed. You have ever been abused or do not feel safe at home. Summary Adopting a healthy lifestyle and getting preventive care are important in promoting health and wellness. Follow your health care provider's instructions about healthy diet, exercising, and getting tested or screened for diseases. Follow your health care provider's instructions on monitoring your cholesterol and blood pressure. This information is not intended to replace advice given to you by your health care provider. Make sure you discuss any questions you have with your health care provider. Document Revised: 07/08/2020 Document Reviewed: 07/08/2020 Elsevier Patient Education  2023 Elsevier Inc.  

## 2022-03-05 LAB — CMP14+EGFR
ALT: 52 IU/L — ABNORMAL HIGH (ref 0–44)
AST: 32 IU/L (ref 0–40)
Albumin/Globulin Ratio: 1.4 (ref 1.2–2.2)
Albumin: 4.7 g/dL (ref 4.1–5.1)
Alkaline Phosphatase: 117 IU/L (ref 44–121)
BUN/Creatinine Ratio: 8 — ABNORMAL LOW (ref 9–20)
BUN: 10 mg/dL (ref 6–24)
Bilirubin Total: 1.4 mg/dL — ABNORMAL HIGH (ref 0.0–1.2)
CO2: 26 mmol/L (ref 20–29)
Calcium: 10.1 mg/dL (ref 8.7–10.2)
Chloride: 98 mmol/L (ref 96–106)
Creatinine, Ser: 1.33 mg/dL — ABNORMAL HIGH (ref 0.76–1.27)
Globulin, Total: 3.3 g/dL (ref 1.5–4.5)
Glucose: 83 mg/dL (ref 70–99)
Potassium: 4.3 mmol/L (ref 3.5–5.2)
Sodium: 139 mmol/L (ref 134–144)
Total Protein: 8 g/dL (ref 6.0–8.5)
eGFR: 66 mL/min/{1.73_m2} (ref >59)

## 2022-03-05 LAB — LIPID PANEL
Chol/HDL Ratio: 3.5 ratio (ref 0.0–5.0)
Cholesterol, Total: 237 mg/dL — ABNORMAL HIGH (ref 100–199)
HDL: 67 mg/dL (ref >39)
LDL Chol Calc (NIH): 152 mg/dL — ABNORMAL HIGH (ref 0–99)
Triglycerides: 103 mg/dL (ref 0–149)
VLDL Cholesterol Cal: 18 mg/dL (ref 5–40)

## 2022-03-05 LAB — CBC
Hematocrit: 41.6 % (ref 37.5–51.0)
Hemoglobin: 13.7 g/dL (ref 13.0–17.7)
MCH: 28.6 pg (ref 26.6–33.0)
MCHC: 32.9 g/dL (ref 31.5–35.7)
MCV: 87 fL (ref 79–97)
Platelets: 274 10*3/uL (ref 150–450)
RBC: 4.79 x10E6/uL (ref 4.14–5.80)
RDW: 11.3 % — ABNORMAL LOW (ref 11.6–15.4)
WBC: 4.9 10*3/uL (ref 3.4–10.8)

## 2022-03-05 LAB — HEMOGLOBIN A1C
Est. average glucose Bld gHb Est-mCnc: 97 mg/dL
Hgb A1c MFr Bld: 5 % (ref 4.8–5.6)

## 2022-04-22 ENCOUNTER — Ambulatory Visit: Payer: Commercial Managed Care - HMO | Admitting: Nurse Practitioner

## 2022-09-07 ENCOUNTER — Ambulatory Visit: Payer: Commercial Managed Care - HMO | Admitting: Nurse Practitioner

## 2022-09-10 ENCOUNTER — Ambulatory Visit: Payer: Commercial Managed Care - HMO | Admitting: Family Medicine

## 2022-09-10 ENCOUNTER — Encounter: Payer: Self-pay | Admitting: Family Medicine

## 2022-09-10 ENCOUNTER — Ambulatory Visit (INDEPENDENT_AMBULATORY_CARE_PROVIDER_SITE_OTHER): Payer: 59 | Admitting: Family Medicine

## 2022-09-10 VITALS — BP 120/76 | HR 79 | Temp 97.9°F | Ht 65.2 in | Wt 191.8 lb

## 2022-09-10 DIAGNOSIS — F419 Anxiety disorder, unspecified: Secondary | ICD-10-CM | POA: Insufficient documentation

## 2022-09-10 DIAGNOSIS — E782 Mixed hyperlipidemia: Secondary | ICD-10-CM | POA: Diagnosis not present

## 2022-09-10 DIAGNOSIS — F5101 Primary insomnia: Secondary | ICD-10-CM

## 2022-09-10 DIAGNOSIS — F321 Major depressive disorder, single episode, moderate: Secondary | ICD-10-CM | POA: Insufficient documentation

## 2022-09-10 DIAGNOSIS — Z6831 Body mass index (BMI) 31.0-31.9, adult: Secondary | ICD-10-CM

## 2022-09-10 DIAGNOSIS — G4709 Other insomnia: Secondary | ICD-10-CM | POA: Insufficient documentation

## 2022-09-10 DIAGNOSIS — E6609 Other obesity due to excess calories: Secondary | ICD-10-CM

## 2022-09-10 MED ORDER — TRAZODONE HCL 50 MG PO TABS: 50.0000 mg | ORAL_TABLET | Freq: Every day | ORAL | 3 refills | Status: DC

## 2022-09-10 MED ORDER — ATORVASTATIN CALCIUM 20 MG PO TABS: 20.0000 mg | ORAL_TABLET | Freq: Every day | ORAL | 11 refills | Status: DC
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
## 2022-09-18 DIAGNOSIS — E782 Mixed hyperlipidemia: Secondary | ICD-10-CM | POA: Insufficient documentation

## 2022-09-18 NOTE — Assessment & Plan Note (Signed)
He is encouraged to strive for BMI less than 30 to decrease cardiac risk. Advised to aim for at least 150 minutes of exercise per week.  

## 2022-09-18 NOTE — Assessment & Plan Note (Signed)
Take medication as prescribed.

## 2022-09-18 NOTE — Assessment & Plan Note (Signed)
Take medications as prescribed

## 2022-10-13 ENCOUNTER — Encounter: Payer: Self-pay | Admitting: Nurse Practitioner

## 2022-10-13 ENCOUNTER — Ambulatory Visit (INDEPENDENT_AMBULATORY_CARE_PROVIDER_SITE_OTHER): Payer: 59 | Admitting: Nurse Practitioner

## 2022-10-13 VITALS — BP 130/80 | HR 65 | Temp 97.9°F | Ht 65.2 in | Wt 195.0 lb

## 2022-10-13 DIAGNOSIS — E782 Mixed hyperlipidemia: Secondary | ICD-10-CM | POA: Diagnosis not present

## 2022-10-13 DIAGNOSIS — Z6832 Body mass index (BMI) 32.0-32.9, adult: Secondary | ICD-10-CM

## 2022-10-13 DIAGNOSIS — F419 Anxiety disorder, unspecified: Secondary | ICD-10-CM | POA: Diagnosis not present

## 2022-10-13 DIAGNOSIS — G4709 Other insomnia: Secondary | ICD-10-CM | POA: Diagnosis not present

## 2022-10-13 DIAGNOSIS — F321 Major depressive disorder, single episode, moderate: Secondary | ICD-10-CM

## 2022-10-13 DIAGNOSIS — E6609 Other obesity due to excess calories: Secondary | ICD-10-CM

## 2022-10-13 MED ORDER — ATORVASTATIN CALCIUM 20 MG PO TABS: 20.0000 mg | ORAL_TABLET | Freq: Every day | ORAL | 1 refills | Status: DC

## 2022-10-13 MED ORDER — TRAZODONE HCL 50 MG PO TABS: 50.0000 mg | ORAL_TABLET | Freq: Every day | ORAL | 3 refills | Status: DC

## 2022-10-13 NOTE — Assessment & Plan Note (Signed)
Cholesterol levels were elevated at last visit, continue statin. Tolerating well.

## 2022-10-13 NOTE — Assessment & Plan Note (Signed)
He is feeling slightly better, he will restart his counseling this week. Continue current medications

## 2022-10-13 NOTE — Assessment & Plan Note (Signed)
Improved with Trazodone. Continue current medications

## 2022-10-13 NOTE — Progress Notes (Signed)
Madelaine Bhat, CMA,acting as a Neurosurgeon for Arnette Felts, FNP.,have documented all relevant documentation on the behalf of Arnette Felts, FNP,as directed by  Arnette Felts, FNP while in the presence of Arnette Felts, FNP.  Subjective:  Patient ID: Joshua Sims , male    DOB: Feb 22, 1976 , 47 y.o.   MRN: [id number]  Chief Complaint  Patient presents with   Anxiety    HPI  Patient presents today for a anxiety follow up, patient reports compliance with medications. Patient denies any chest pain, SOB, or headache. Patient has no other concerns today. He is now getting some sleep after starting the trazodone. He does have his days where his mood is up and down but feels better since the first day he came. Continues with therapy - first visit will be on Sunday after 2 months.       Past Medical History:  Diagnosis Date   Allergy    Anxiety    Depression      Family History  Problem Relation Age of Onset   Sudden death Neg Hx    Hypertension Neg Hx    Hyperlipidemia Neg Hx    Heart attack Neg Hx    Diabetes Neg Hx      Current Outpatient Medications:    Ashwagandha 120 MG CAPS, Take 1 tablet by mouth at bedtime., Disp: 30 capsule, Rfl: 2   cetirizine (ZYRTEC ALLERGY) 10 MG tablet, Take 1 tablet (10 mg total) by mouth daily., Disp: 30 tablet, Rfl: 2   fluticasone (FLONASE) 50 MCG/ACT nasal spray, Place 2 sprays into both nostrils daily., Disp: 16 g, Rfl: 2   Magnesium 400 MG TABS, Take 1 tablet by mouth every evening., Disp: 30 tablet, Rfl: 2   melatonin 5 MG TABS, Take 1 tablet (5 mg total) by mouth at bedtime as needed., Disp: 90 tablet, Rfl: 1   Pramoxine-HC 1-2.5 % OINT, Apply 1 each topically 2 (two) times daily as needed., Disp: 28.4 g, Rfl: 5   atorvastatin (LIPITOR) 20 MG tablet, Take 1 tablet (20 mg total) by mouth daily., Disp: 90 tablet, Rfl: 1   traZODone (DESYREL) 50 MG tablet, Take 1 tablet (50 mg total) by mouth at bedtime., Disp: 60 tablet, Rfl: 3   No Known Allergies    Review of Systems  Constitutional: Negative.   Respiratory: Negative.    Cardiovascular: Negative.   Musculoskeletal: Negative.   Neurological: Negative.   Psychiatric/Behavioral: Negative.       Today's Vitals   10/13/22 1608  BP: 130/80  Pulse: 65  Temp: 97.9 F (36.6 C)  Weight: 195 lb (88.5 kg)  Height: 5' 5.2" (1.656 m)  PainSc: 0-No pain   Body mass index is 32.25 kg/m.  Wt Readings from Last 3 Encounters:  10/13/22 195 lb (88.5 kg)  09/10/22 191 lb 12.8 oz (87 kg)  03/04/22 186 lb (84.4 kg)     Objective:  Physical Exam Vitals reviewed.  Constitutional:      General: He is not in acute distress.    Appearance: Normal appearance. He is obese.  Cardiovascular:     Rate and Rhythm: Normal rate and regular rhythm.     Pulses: Normal pulses.     Heart sounds: Normal heart sounds. No murmur heard. Pulmonary:     Effort: Pulmonary effort is normal. No respiratory distress.     Breath sounds: Normal breath sounds. No wheezing.  Skin:    Capillary Refill: Capillary refill takes less than 2 seconds.  Neurological:     General: No focal deficit present.     Mental Status: He is alert and oriented to person, place, and time.  Psychiatric:        Mood and Affect: Mood normal.        Behavior: Behavior normal.        Thought Content: Thought content normal.        Judgment: Judgment normal.     Assessment And Plan:  Anxiety Assessment & Plan: He is doing well with the trazodone and is also helping him to sleep  Orders: -     traZODone HCl; Take 1 tablet (50 mg total) by mouth at bedtime.  Dispense: 60 tablet; Refill: 3 -     CMP14+EGFR  Mixed hyperlipidemia Assessment & Plan: Cholesterol levels were elevated at last visit, continue statin. Tolerating well.   Orders: -     Atorvastatin Calcium; Take 1 tablet (20 mg total) by mouth daily.  Dispense: 90 tablet; Refill: 1 -     Lipid panel -     CMP14+EGFR  Other insomnia Assessment & Plan: Improved  with Trazodone. Continue current medications   Current moderate episode of major depressive disorder without prior episode [facility]) Assessment & Plan: He is feeling slightly better, he will restart his counseling this week. Continue current medications  Orders: -     traZODone HCl; Take 1 tablet (50 mg total) by mouth at bedtime.  Dispense: 60 tablet; Refill: 3 -     CMP14+EGFR  Class 1 obesity due to excess calories with body mass index (BMI) of 32.0 to 32.9 in adult, unspecified whether serious comorbidity present Assessment & Plan: She is encouraged to strive for BMI less than 30 to decrease cardiac risk. Advised to aim for at least 150 minutes of exercise per week.      Return if symptoms worsen or fail to improve, for keep next appt as scheduled .   Patient was given opportunity to ask questions. Patient verbalized understanding of the plan and was able to repeat key elements of the plan. All questions were answered to their satisfaction.    Jeanell Sparrow, FNP, have reviewed all documentation for this visit. The documentation on 10/13/22 for the exam, diagnosis, procedures, and orders are all accurate and complete.   IF YOU HAVE BEEN REFERRED TO A SPECIALIST, IT MAY TAKE 1-2 WEEKS TO SCHEDULE/PROCESS THE REFERRAL. IF YOU HAVE NOT HEARD FROM US/SPECIALIST IN TWO WEEKS, PLEASE GIVE [place] A CALL AT [phone].

## 2022-10-13 NOTE — Assessment & Plan Note (Signed)
She is encouraged to strive for BMI less than 30 to decrease cardiac risk. Advised to aim for at least 150 minutes of exercise per week.  

## 2022-10-13 NOTE — Assessment & Plan Note (Signed)
He is doing well with the trazodone and is also helping him to sleep

## 2023-03-09 ENCOUNTER — Encounter: Payer: Self-pay | Admitting: Nurse Practitioner

## 2023-04-13 ENCOUNTER — Ambulatory Visit (INDEPENDENT_AMBULATORY_CARE_PROVIDER_SITE_OTHER): Payer: No Typology Code available for payment source | Admitting: Nurse Practitioner

## 2023-04-13 ENCOUNTER — Encounter: Payer: Self-pay | Admitting: Nurse Practitioner

## 2023-04-13 VITALS — BP 120/70 | HR 85 | Temp 98.4°F | Ht 65.0 in | Wt 197.2 lb

## 2023-04-13 DIAGNOSIS — F3341 Major depressive disorder, recurrent, in partial remission: Secondary | ICD-10-CM

## 2023-04-13 DIAGNOSIS — F419 Anxiety disorder, unspecified: Secondary | ICD-10-CM

## 2023-04-13 DIAGNOSIS — Z6832 Body mass index (BMI) 32.0-32.9, adult: Secondary | ICD-10-CM

## 2023-04-13 DIAGNOSIS — E6609 Other obesity due to excess calories: Secondary | ICD-10-CM

## 2023-04-13 DIAGNOSIS — G4709 Other insomnia: Secondary | ICD-10-CM

## 2023-04-13 DIAGNOSIS — E66811 Obesity, class 1: Secondary | ICD-10-CM

## 2023-04-13 DIAGNOSIS — Z79899 Other long term (current) drug therapy: Secondary | ICD-10-CM

## 2023-04-13 DIAGNOSIS — Z0000 Encounter for general adult medical examination without abnormal findings: Secondary | ICD-10-CM

## 2023-04-13 DIAGNOSIS — E782 Mixed hyperlipidemia: Secondary | ICD-10-CM | POA: Diagnosis not present

## 2023-04-13 DIAGNOSIS — Z125 Encounter for screening for malignant neoplasm of prostate: Secondary | ICD-10-CM

## 2023-04-13 DIAGNOSIS — Z2821 Immunization not carried out because of patient refusal: Secondary | ICD-10-CM

## 2023-04-13 MED ORDER — FLUTICASONE PROPIONATE 50 MCG/ACT NA SUSP: 2.0000 | Freq: Every day | NASAL | 2 refills | Status: AC

## 2023-04-13 MED ORDER — ATORVASTATIN CALCIUM 20 MG PO TABS: 20.0000 mg | ORAL_TABLET | Freq: Every day | ORAL | 1 refills | Status: DC

## 2023-04-13 MED ORDER — TRAZODONE HCL 50 MG PO TABS: 50.0000 mg | ORAL_TABLET | Freq: Every day | ORAL | 3 refills | Status: AC

## 2023-04-13 NOTE — Assessment & Plan Note (Signed)
Improved with Trazodone. Continue current medications

## 2023-04-13 NOTE — Assessment & Plan Note (Signed)
Behavior modifications discussed and diet history reviewed.   Pt will continue to exercise regularly and modify diet with low GI, plant based foods and decrease intake of processed foods.  Recommend intake of daily multivitamin, Vitamin D, and calcium.  Recommend for preventive screenings, as well as recommend immunizations that include influenza, TDAP

## 2023-04-13 NOTE — Assessment & Plan Note (Signed)

## 2023-04-13 NOTE — Progress Notes (Signed)
 Madelaine Bhat, CMA,acting as a Neurosurgeon for Arnette Felts, FNP.,have documented all relevant documentation on the behalf of Arnette Felts, FNP,as directed by  Arnette Felts, FNP while in the presence of Arnette Felts, FNP.  Subjective:   Patient ID: Joshua Sims , male    DOB: [birth year] , 48 y.o.   MRN: [id number]  Chief Complaint  Patient presents with   Annual Exam    HPI  Patient presents today for HM, Patient reports compliance with medication. Patient denies any chest pain, SOB, or headaches. Patient has no concerns today. He is working with [employer] since January 6th. Reports every thing is going well.      Past Medical History:  Diagnosis Date   Allergy    Anxiety    Depression      Family History  Problem Relation Age of Onset   Sudden death Neg Hx    Hypertension Neg Hx    Hyperlipidemia Neg Hx    Heart attack Neg Hx    Diabetes Neg Hx      Current Outpatient Medications:    Ashwagandha 120 MG CAPS, Take 1 tablet by mouth at bedtime., Disp: 30 capsule, Rfl: 2   cetirizine (ZYRTEC ALLERGY) 10 MG tablet, Take 1 tablet (10 mg total) by mouth daily., Disp: 30 tablet, Rfl: 2   Magnesium 400 MG TABS, Take 1 tablet by mouth every evening., Disp: 30 tablet, Rfl: 2   melatonin 5 MG TABS, Take 1 tablet (5 mg total) by mouth at bedtime as needed., Disp: 90 tablet, Rfl: 1   Pramoxine-HC 1-2.5 % OINT, Apply 1 each topically 2 (two) times daily as needed., Disp: 28.4 g, Rfl: 5   atorvastatin (LIPITOR) 20 MG tablet, Take 1 tablet (20 mg total) by mouth daily., Disp: 90 tablet, Rfl: 1   fluticasone (FLONASE) 50 MCG/ACT nasal spray, Place 2 sprays into both nostrils daily., Disp: 16 g, Rfl: 2   traZODone (DESYREL) 50 MG tablet, Take 1 tablet (50 mg total) by mouth at bedtime., Disp: 60 tablet, Rfl: 3   No Known Allergies   Men's preventive visit. 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 Exercise - none but will start walking now that he has a schedule for work. Marital status: Married. Relevant history for alcohol use is:  Social History   Substance and Sexual Activity  Alcohol Use Yes   Comment: socially  . Relevant history for tobacco use is:  Social History   Tobacco Use  Smoking Status Never  Smokeless Tobacco Not on file  .   Review of Systems  Constitutional: Negative.   HENT: Negative.    Eyes: Negative.   Respiratory: Negative.    Cardiovascular: Negative.   Gastrointestinal: Negative.   Endocrine: Negative.   Genitourinary: Negative.   Musculoskeletal: Negative.   Skin: Negative.   Allergic/Immunologic: Negative.   Neurological: Negative.   Hematological: Negative.   Psychiatric/Behavioral: Negative.       Today's Vitals   04/13/23 1607  BP: 120/70  Pulse: 85  Temp: 98.4 F (36.9 C)  TempSrc: Oral  Weight: 197 lb 3.2 oz (89.4 kg)  Height: 5\' 5"  (1.651 m)  PainSc: 0-No pain   Body mass index is 32.82 kg/m.  Wt Readings  from Last 3 Encounters:  04/13/23 197 lb 3.2 oz (89.4 kg)  10/13/22 195 lb (88.5 kg)  09/10/22 191 lb 12.8 oz (87 kg)    Objective:  Physical Exam Vitals reviewed.  Constitutional:      General: He is not in acute distress.    Appearance: Normal appearance. He is obese.  HENT:     Head: Normocephalic and atraumatic.     Right Ear: Tympanic membrane, ear canal and external ear normal. There is no impacted cerumen.     Left Ear: Tympanic membrane, ear canal and external ear normal. There is no impacted cerumen.     Nose: Nose normal.     Mouth/Throat:     Mouth: Mucous membranes are moist.     Comments: Right lower molar with decay Eyes:     Extraocular Movements: Extraocular movements intact.     Conjunctiva/sclera: Conjunctivae normal.     Pupils: Pupils are equal, round,  and reactive to light.  Cardiovascular:     Rate and Rhythm: Normal rate and regular rhythm.     Pulses: Normal pulses.     Heart sounds: Normal heart sounds. No murmur heard. Pulmonary:     Effort: Pulmonary effort is normal. No respiratory distress.     Breath sounds: Normal breath sounds.  Abdominal:     General: Abdomen is flat. Bowel sounds are normal. There is no distension.     Palpations: Abdomen is soft.  Genitourinary:    Prostate: Normal.     Rectum: Guaiac result negative.  Musculoskeletal:        General: Normal range of motion.     Cervical back: Normal range of motion and neck supple.  Skin:    General: Skin is warm.     Capillary Refill: Capillary refill takes less than 2 seconds.  Neurological:     General: No focal deficit present.     Mental Status: He is alert and oriented to person, place, and time.  Psychiatric:        Mood and Affect: Mood normal.        Behavior: Behavior normal.        Thought Content: Thought content normal.        Judgment: Judgment normal.        04/13/2023    4:17 PM 09/10/2022    3:04 PM 03/04/2022   11:50 AM 01/20/2022    4:17 PM 11/18/2021   11:36 AM  Depression screen PHQ 2/9  Decreased Interest 0 1 0 0 1  Down, Depressed, Hopeless 1 1 1 1 1   PHQ - 2 Score 1 2 1 1 2   Altered sleeping 1 3 0  3  Tired, decreased energy 0 1 0  2  Change in appetite 0 0 0  3  Feeling bad or failure about yourself  0 2 0  1  Trouble concentrating 0 0 0  0  Moving slowly or fidgety/restless 0 0 0  0  Suicidal thoughts 0 0 0  0  PHQ-9 Score 2 8 1  11   Difficult doing work/chores Not difficult at all Somewhat difficult   Not difficult at all       Assessment And Plan:    Encounter for annual health examination Assessment & Plan: Behavior modifications discussed and diet history reviewed.   Pt will continue to exercise regularly and modify diet with low GI, plant based foods and decrease intake of processed foods.  Recommend intake of  daily multivitamin, Vitamin  D, and calcium.  Recommend for preventive screenings, as well as recommend immunizations that include influenza, TDAP    Encounter for prostate cancer screening -     PSA  COVID-19 vaccination declined Assessment & Plan: Declines covid 19 vaccine. Discussed risk of covid 42 and if he changes her mind about the vaccine to call the office. Education has been provided regarding the importance of this vaccine but patient still declined. Advised may receive this vaccine at local pharmacy or Health Dept.or vaccine clinic. Aware to provide a copy of the vaccination record if obtained from local pharmacy or Health Dept.  Encouraged to take multivitamin, vitamin d, vitamin c and zinc to increase immune system. Aware can call office if would like to have vaccine here at office. Verbalized acceptance and understanding.    Influenza vaccination declined Assessment & Plan: Patient declined influenza vaccination at this time. Patient is aware that influenza vaccine prevents illness in 70% of healthy people, and reduces hospitalizations to 30-70% in elderly. This vaccine is recommended annually. Education has been provided regarding the importance of this vaccine but patient still declined. Advised may receive this vaccine at local pharmacy or Health Dept.or vaccine clinic. Aware to provide a copy of the vaccination record if obtained from local pharmacy or Health Dept.  Pt is willing to accept risk associated with refusing vaccination.    Anxiety Assessment & Plan: He is doing well with the trazodone and is also helping him to sleep  Orders: -     CMP14+EGFR -     traZODone HCl; Take 1 tablet (50 mg total) by mouth at bedtime.  Dispense: 60 tablet; Refill: 3  Mixed hyperlipidemia Assessment & Plan: Cholesterol levels stable. continue statin. Tolerating well.   Orders: -     CMP14+EGFR -     Lipid panel -     Atorvastatin Calcium; Take 1 tablet (20 mg total) by mouth  daily.  Dispense: 90 tablet; Refill: 1  Other insomnia Assessment & Plan: Improved with Trazodone. Continue current medications   Recurrent major depressive disorder, in partial remission (HCC) -     traZODone HCl; Take 1 tablet (50 mg total) by mouth at bedtime.  Dispense: 60 tablet; Refill: 3  Class 1 obesity due to excess calories with body mass index (BMI) of 32.0 to 32.9 in adult, unspecified whether serious comorbidity present Assessment & Plan: he is encouraged to strive for BMI less than 30 to decrease cardiac risk. Advised to aim for at least 150 minutes of exercise per week.    Other long term (current) drug therapy -     CBC with Differential/Platelet  Other orders -     Fluticasone Propionate; Place 2 sprays into both nostrils daily.  Dispense: 16 g; Refill: 2    Return for 1 year physical, 6 month chol check. Patient was given opportunity to ask questions. Patient verbalized understanding of the plan and was able to repeat key elements of the plan. All questions were answered to their satisfaction.   Arnette Felts, FNP  I, Arnette Felts, FNP, have reviewed all documentation for this visit. The documentation on 04/13/23 for the exam, diagnosis, procedures, and orders are all accurate and complete.

## 2023-04-13 NOTE — Assessment & Plan Note (Signed)
He is doing well with the trazodone and is also helping him to sleep

## 2023-04-13 NOTE — Assessment & Plan Note (Signed)
he is encouraged to strive for BMI less than 30 to decrease cardiac risk. Advised to aim for at least 150 minutes of exercise per week.

## 2023-04-13 NOTE — Assessment & Plan Note (Signed)

## 2023-04-13 NOTE — Assessment & Plan Note (Signed)
Cholesterol levels stable. continue statin. Tolerating well.

## 2023-04-14 ENCOUNTER — Encounter: Payer: Self-pay | Admitting: Nurse Practitioner

## 2023-04-14 LAB — LIPID PANEL
Chol/HDL Ratio: 3.7 {ratio} (ref 0.0–5.0)
Cholesterol, Total: 205 mg/dL — ABNORMAL HIGH (ref 100–199)
HDL: 56 mg/dL (ref >39)
LDL Chol Calc (NIH): 129 mg/dL — ABNORMAL HIGH (ref 0–99)
Triglycerides: 114 mg/dL (ref 0–149)
VLDL Cholesterol Cal: 20 mg/dL (ref 5–40)

## 2023-04-14 LAB — CBC WITH DIFFERENTIAL/PLATELET
Basophils Absolute: 0 10*3/uL (ref 0.0–0.2)
Basos: 0 %
EOS (ABSOLUTE): 0 10*3/uL (ref 0.0–0.4)
Eos: 1 %
Hematocrit: 40.2 % (ref 37.5–51.0)
Hemoglobin: 13.1 g/dL (ref 13.0–17.7)
Immature Grans (Abs): 0 10*3/uL (ref 0.0–0.1)
Immature Granulocytes: 1 %
Lymphocytes Absolute: 2.6 10*3/uL (ref 0.7–3.1)
Lymphs: 37 %
MCH: 28.4 pg (ref 26.6–33.0)
MCHC: 32.6 g/dL (ref 31.5–35.7)
MCV: 87 fL (ref 79–97)
Monocytes Absolute: 0.5 10*3/uL (ref 0.1–0.9)
Monocytes: 8 %
Neutrophils Absolute: 3.8 10*3/uL (ref 1.4–7.0)
Neutrophils: 53 %
Platelets: 355 10*3/uL (ref 150–450)
RBC: 4.62 x10E6/uL (ref 4.14–5.80)
RDW: 11 % — ABNORMAL LOW (ref 11.6–15.4)
WBC: 7 10*3/uL (ref 3.4–10.8)

## 2023-04-14 LAB — CMP14+EGFR
ALT: 96 [IU]/L — ABNORMAL HIGH (ref 0–44)
AST: 47 [IU]/L — ABNORMAL HIGH (ref 0–40)
Albumin: 4.7 g/dL (ref 4.1–5.1)
Alkaline Phosphatase: 98 [IU]/L (ref 44–121)
BUN/Creatinine Ratio: 10 (ref 9–20)
BUN: 11 mg/dL (ref 6–24)
Bilirubin Total: 1.4 mg/dL — ABNORMAL HIGH (ref 0.0–1.2)
CO2: 25 mmol/L (ref 20–29)
Calcium: 9.9 mg/dL (ref 8.7–10.2)
Chloride: 99 mmol/L (ref 96–106)
Creatinine, Ser: 1.11 mg/dL (ref 0.76–1.27)
Globulin, Total: 3.2 g/dL (ref 1.5–4.5)
Glucose: 99 mg/dL (ref 70–99)
Potassium: 3.7 mmol/L (ref 3.5–5.2)
Sodium: 138 mmol/L (ref 134–144)
Total Protein: 7.9 g/dL (ref 6.0–8.5)
eGFR: 82 mL/min/{1.73_m2} (ref >59)

## 2023-04-14 LAB — PSA: Prostate Specific Ag, Serum: 0.7 ng/mL (ref 0.0–4.0)

## 2023-05-03 ENCOUNTER — Other Ambulatory Visit: Payer: No Typology Code available for payment source

## 2023-05-03 ENCOUNTER — Other Ambulatory Visit: Payer: Self-pay | Admitting: Nurse Practitioner

## 2023-05-03 DIAGNOSIS — R748 Abnormal levels of other serum enzymes: Secondary | ICD-10-CM

## 2023-05-04 ENCOUNTER — Other Ambulatory Visit: Payer: Self-pay | Admitting: Nurse Practitioner

## 2023-05-04 ENCOUNTER — Encounter: Payer: Self-pay | Admitting: Nurse Practitioner

## 2023-05-04 DIAGNOSIS — R748 Abnormal levels of other serum enzymes: Secondary | ICD-10-CM

## 2023-05-04 LAB — HEPATIC FUNCTION PANEL
ALT: 90 IU/L — ABNORMAL HIGH (ref 0–44)
AST: 43 IU/L — ABNORMAL HIGH (ref 0–40)
Albumin: 4.5 g/dL (ref 4.1–5.1)
Alkaline Phosphatase: 102 IU/L (ref 44–121)
Bilirubin Total: 1.5 mg/dL — ABNORMAL HIGH (ref 0.0–1.2)
Bilirubin, Direct: 0.43 mg/dL — ABNORMAL HIGH (ref 0.00–0.40)
Total Protein: 7.8 g/dL (ref 6.0–8.5)

## 2023-05-12 ENCOUNTER — Encounter: Payer: Self-pay | Admitting: Nurse Practitioner

## 2023-05-12 ENCOUNTER — Ambulatory Visit
Admission: RE | Admit: 2023-05-12 | Discharge: 2023-05-12 | Disposition: A | Discharge status: Home / Self Care | Payer: Self-pay | Source: Ambulatory Visit | Attending: Nurse Practitioner | Admitting: Nurse Practitioner

## 2023-05-12 DIAGNOSIS — R748 Abnormal levels of other serum enzymes: Secondary | ICD-10-CM

## 2023-10-11 ENCOUNTER — Ambulatory Visit (INDEPENDENT_AMBULATORY_CARE_PROVIDER_SITE_OTHER): Payer: No Typology Code available for payment source | Admitting: Nurse Practitioner

## 2023-10-11 ENCOUNTER — Encounter: Payer: Self-pay | Admitting: Nurse Practitioner

## 2023-10-11 VITALS — BP 120/84 | HR 62 | Temp 98.2°F | Ht 65.0 in | Wt 197.0 lb

## 2023-10-11 DIAGNOSIS — Z6832 Body mass index (BMI) 32.0-32.9, adult: Secondary | ICD-10-CM

## 2023-10-11 DIAGNOSIS — F3341 Major depressive disorder, recurrent, in partial remission: Secondary | ICD-10-CM | POA: Insufficient documentation

## 2023-10-11 DIAGNOSIS — E6609 Other obesity due to excess calories: Secondary | ICD-10-CM

## 2023-10-11 DIAGNOSIS — K5900 Constipation, unspecified: Secondary | ICD-10-CM | POA: Diagnosis not present

## 2023-10-11 DIAGNOSIS — E782 Mixed hyperlipidemia: Secondary | ICD-10-CM

## 2023-10-11 DIAGNOSIS — E66811 Obesity, class 1: Secondary | ICD-10-CM

## 2023-10-11 DIAGNOSIS — F419 Anxiety disorder, unspecified: Secondary | ICD-10-CM

## 2023-10-11 DIAGNOSIS — Z79899 Other long term (current) drug therapy: Secondary | ICD-10-CM

## 2023-10-11 DIAGNOSIS — Z139 Encounter for screening, unspecified: Secondary | ICD-10-CM

## 2023-10-11 MED ORDER — ATORVASTATIN CALCIUM 20 MG PO TABS: 20.0000 mg | ORAL_TABLET | Freq: Every day | ORAL | 1 refills | Status: AC

## 2023-10-11 NOTE — Assessment & Plan Note (Signed)
 Depression score is 1, he feels much better.

## 2023-10-11 NOTE — Assessment & Plan Note (Signed)
 Bowel movements infrequent, stools hard. - Recommend daily stool softener. - Advise taking Benefiber daily. - Instruct to monitor bowel movements and adjust fiber intake if stools become loose. - I have encouraged him to increase his fiber intake, he can take benefiber and a stool softener.

## 2023-10-11 NOTE — Progress Notes (Signed)
 I,[name], CMA,acting as a Neurosurgeon for [employer], FNP.,have documented all relevant documentation on the behalf of Joshua Ada, FNP,as directed by  Joshua Ada, FNP while in the presence of Joshua Ada, FNP.  Subjective:  Patient ID: Joshua Sims , male    DOB: 02-Aug-1975 , 48 y.o.   MRN: [id number]  Chief Complaint  Patient presents with   Hyperlipidemia    Patient presents today for a chol follow up, patient reports compliance with medications. Patient denies any chest pain, SOB, or headache. Patient has no other concerns today.     HPI Discussed the use of AI scribe software for clinical note transcription with the patient, who gave verbal consent to proceed.  History of Present Illness Joshua L Linch is a 48 year old male with hyperlipidemia and depression who presents for follow-up on cholesterol management and mood.  He experiences constipation, which he attributes to his cholesterol medication. His bowel movements are infrequent and hard, with the last one occurring three days ago. He has been eating mainly salads and drinking about two tall bottles of water daily, but acknowledges insufficient water intake. No nausea or vomiting.  Regarding his mood, he states that he is doing 'pretty good' and does not report any significant mood disturbances since his last visit.  He has not seen any other doctors since his last visit, except for a consultation regarding liver enzymes, which was previously arranged. 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   Hyperlipidemia This is a chronic problem. There are no known factors aggravating his hyperlipidemia. Pertinent negatives include no chest pain. Risk factors for coronary artery disease include dyslipidemia.  Depression        This is a recurrent problem.  The current episode started more than 1 month ago.   The onset quality is gradual.    Associated symptoms include no decreased concentration.  Past treatments include nothing.  Risk factors include stress.    Pertinent negatives include no anxiety.    Past Medical History:  Diagnosis Date   Allergy    Anxiety    Depression      Family History  Problem Relation Age of Onset   Sudden death Neg Hx    Hypertension Neg Hx    Hyperlipidemia Neg Hx    Heart attack Neg Hx    Diabetes Neg Hx      Current Outpatient Medications:    Ashwagandha 120 MG CAPS, Take 1 tablet by mouth at bedtime., Disp: 30 capsule, Rfl: 2   cetirizine  (ZYRTEC  ALLERGY) 10 MG tablet, Take 1 tablet (10 mg total) by mouth daily., Disp: 30 tablet, Rfl: 2   fluticasone  (FLONASE ) 50 MCG/ACT nasal spray, Place 2 sprays into both nostrils daily., Disp: 16 g, Rfl: 2   Magnesium  400 MG TABS, Take 1 tablet by mouth every evening., Disp: 30 tablet, Rfl: 2   melatonin 5 MG TABS, Take 1 tablet (5 mg total) by mouth at bedtime as needed., Disp: 90 tablet, Rfl: 1   Pramoxine-HC 1-2.5 % OINT, Apply 1 each topically 2 (two) times daily as needed., Disp: 28.4 g, Rfl: 5   traZODone  (DESYREL ) 50 MG tablet, Take 1 tablet (50 mg total) by mouth at bedtime., Disp: 60 tablet, Rfl: 3   atorvastatin  (LIPITOR) 20 MG tablet, Take 1 tablet (20 mg total) by mouth daily., Disp: 90 tablet, Rfl: 1   No Known Allergies   Review  of Systems  Constitutional: Negative.   Eyes: Negative.   Respiratory: Negative.    Cardiovascular:  Negative for chest pain.  Gastrointestinal:  Positive for constipation.  Musculoskeletal: Negative.   Skin: Negative.   Psychiatric/Behavioral: Negative.  Negative for decreased concentration.      Today's Vitals   10/11/23 0832  BP: 120/84  Pulse: 62  Temp: 98.2 F (36.8 C)  TempSrc: Oral  Weight: 197 lb (89.4 kg)  Height: 5' 5 (1.651 m)  PainSc: 0-No pain   Body mass index is 32.78 kg/m.  Wt Readings from Last 3 Encounters:  10/11/23 197 lb (89.4 kg)  04/13/23 197 lb 3.2 oz (89.4 kg)   10/13/22 195 lb (88.5 kg)      Objective:  Physical Exam Vitals and nursing note reviewed.  Constitutional:      General: He is not in acute distress.    Appearance: Normal appearance. He is obese.  Cardiovascular:     Rate and Rhythm: Normal rate and regular rhythm.     Pulses: Normal pulses.     Heart sounds: Normal heart sounds. No murmur heard. Pulmonary:     Effort: Pulmonary effort is normal. No respiratory distress.     Breath sounds: Normal breath sounds. No wheezing.  Skin:    Capillary Refill: Capillary refill takes less than 2 seconds.  Neurological:     General: No focal deficit present.     Mental Status: He is alert and oriented to person, place, and time.  Psychiatric:        Mood and Affect: Mood normal.        Behavior: Behavior normal.        Thought Content: Thought content normal.        Judgment: Judgment normal.        10/11/2023    8:40 AM 04/13/2023    4:17 PM 09/10/2022    3:04 PM 03/04/2022   11:50 AM 01/20/2022    4:17 PM  Depression screen PHQ 2/9  Decreased Interest 0 0 1 0 0  Down, Depressed, Hopeless 0 1 1 1 1   PHQ - 2 Score 0 1 2 1 1   Altered sleeping 0 1 3 0   Tired, decreased energy 1 0 1 0   Change in appetite 0 0 0 0   Feeling bad or failure about yourself  0 0 2 0   Trouble concentrating 0 0 0 0   Moving slowly or fidgety/restless 0 0 0 0   Suicidal thoughts 0 0 0 0   PHQ-9 Score 1 2 8 1    Difficult doing work/chores Not difficult at all Not difficult at all Somewhat difficult        10/11/2023    8:40 AM 04/13/2023    4:17 PM 09/10/2022    3:04 PM  GAD 7 : Generalized Anxiety Score  Nervous, Anxious, on Edge 0 0 0  Control/stop worrying 0 0 1  Worry too much - different things 1 1 1   Trouble relaxing 0 0 1  Restless 0 0 1  Easily annoyed or irritable 0 1 3  Afraid - awful might happen 0 0 0  Total GAD 7 Score 1 2 7   Anxiety Difficulty Not difficult at all Not difficult at all Somewhat difficult     Assessment And  Plan:  Mixed hyperlipidemia -     CMP14+EGFR -     Lipid panel -     Atorvastatin  Calcium ; Take 1 tablet (20 mg total)  by mouth daily.  Dispense: 90 tablet; Refill: 1  Anxiety  Recurrent major depressive disorder, in partial remission (HCC)  Class 1 obesity due to excess calories with body mass index (BMI) of 32.0 to 32.9 in adult, unspecified whether serious comorbidity present  Other long term (current) drug therapy -     CBC  Encounter for screening -     Hepatitis B surface antibody,qualitative    Return if symptoms worsen or fail to improve.  Patient was given opportunity to ask questions. Patient verbalized understanding of the plan and was able to repeat key elements of the plan. All questions were answered to their satisfaction.    LILLETTE Joshua Ada, FNP, have reviewed all documentation for this visit. The documentation on 10/11/23 for the exam, diagnosis, procedures, and orders are all accurate and complete.   IF YOU HAVE BEEN REFERRED TO A SPECIALIST, IT MAY TAKE 1-2 WEEKS TO SCHEDULE/PROCESS THE REFERRAL. IF YOU HAVE NOT HEARD FROM US /SPECIALIST IN TWO WEEKS, PLEASE GIVE US  A CALL AT [phone].

## 2023-10-11 NOTE — Assessment & Plan Note (Signed)
 Managed with atorvastatin . Constipation possibly related to atorvastatin . - Refill atorvastatin  20 mg orally daily. - Recommend daily stool softener. - Advise increasing dietary fiber intake with Benefiber.

## 2023-10-11 NOTE — Assessment & Plan Note (Signed)
 His weight is stable. He is encouraged to strive for BMI less than 30 to decrease cardiac risk. Advised to aim for at least 150 minutes of exercise per week.

## 2023-10-11 NOTE — Assessment & Plan Note (Signed)
 Doing well. Controlled, anxiety screen score 1.

## 2023-10-11 NOTE — Patient Instructions (Signed)

## 2023-10-12 LAB — CMP14+EGFR
ALT: 66 IU/L — ABNORMAL HIGH (ref 0–44)
AST: 42 IU/L — ABNORMAL HIGH (ref 0–40)
Albumin: 4.3 g/dL (ref 4.1–5.1)
Alkaline Phosphatase: 102 IU/L (ref 44–121)
BUN/Creatinine Ratio: 13 (ref 9–20)
BUN: 18 mg/dL (ref 6–24)
Bilirubin Total: 1.5 mg/dL — ABNORMAL HIGH (ref 0.0–1.2)
CO2: 22 mmol/L (ref 20–29)
Calcium: 9.4 mg/dL (ref 8.7–10.2)
Chloride: 101 mmol/L (ref 96–106)
Creatinine, Ser: 1.38 mg/dL — ABNORMAL HIGH (ref 0.76–1.27)
Globulin, Total: 3.1 g/dL (ref 1.5–4.5)
Glucose: 105 mg/dL — ABNORMAL HIGH (ref 70–99)
Potassium: 4.1 mmol/L (ref 3.5–5.2)
Sodium: 139 mmol/L (ref 134–144)
Total Protein: 7.4 g/dL (ref 6.0–8.5)
eGFR: 63 mL/min/1.73 (ref >59)

## 2023-10-12 LAB — CBC
Hematocrit: 42.2 % (ref 37.5–51.0)
Hemoglobin: 13.4 g/dL (ref 13.0–17.7)
MCH: 28.8 pg (ref 26.6–33.0)
MCHC: 31.8 g/dL (ref 31.5–35.7)
MCV: 91 fL (ref 79–97)
Platelets: 259 x10E3/uL (ref 150–450)
RBC: 4.66 x10E6/uL (ref 4.14–5.80)
RDW: 11.3 % — ABNORMAL LOW (ref 11.6–15.4)
WBC: 4.5 x10E3/uL (ref 3.4–10.8)

## 2023-10-12 LAB — LIPID PANEL
Chol/HDL Ratio: 2.7 ratio (ref 0.0–5.0)
Cholesterol, Total: 132 mg/dL (ref 100–199)
HDL: 49 mg/dL (ref >39)
LDL Chol Calc (NIH): 64 mg/dL (ref 0–99)
Triglycerides: 100 mg/dL (ref 0–149)
VLDL Cholesterol Cal: 19 mg/dL (ref 5–40)

## 2023-10-12 LAB — HEPATITIS B SURFACE ANTIBODY,QUALITATIVE: Hep B Surface Ab, Qual: NONREACTIVE

## 2024-04-17 ENCOUNTER — Encounter: Payer: Self-pay | Admitting: Nurse Practitioner
